# Patient Record
Sex: Female | Born: 1990 | Race: White | Hispanic: Yes | Marital: Married | Smoking: Former smoker
Health system: Southern US, Community
[De-identification: ages and names within clinical notes are randomized; demographics above are authoritative.]

## PROBLEM LIST (undated history)

## (undated) ENCOUNTER — Inpatient Hospital Stay (HOSPITAL_COMMUNITY): Payer: Self-pay

## (undated) HISTORY — PX: WISDOM TOOTH EXTRACTION: SHX21

---
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
## 2009-04-06 ENCOUNTER — Inpatient Hospital Stay (HOSPITAL_COMMUNITY): Admission: AD | Admit: 2009-04-06 | Discharge: 2009-04-06 | Payer: Self-pay | Admitting: Obstetrics and Gynecology

## 2009-04-26 ENCOUNTER — Inpatient Hospital Stay (HOSPITAL_COMMUNITY): Admission: AD | Admit: 2009-04-26 | Discharge: 2009-04-28 | Payer: Self-pay | Admitting: Obstetrics & Gynecology

## 2009-11-16 ENCOUNTER — Emergency Department (HOSPITAL_COMMUNITY): Admission: EM | Admit: 2009-11-16 | Discharge: 2009-11-17 | Payer: Self-pay | Admitting: Emergency Medicine

## 2010-06-18 ENCOUNTER — Emergency Department (HOSPITAL_COMMUNITY)
Admission: EM | Admit: 2010-06-18 | Discharge: 2010-06-18 | Payer: Self-pay | Source: Home / Self Care | Admitting: Emergency Medicine

## 2010-09-29 ENCOUNTER — Emergency Department (HOSPITAL_COMMUNITY)
Admission: EM | Admit: 2010-09-29 | Discharge: 2010-09-29 | Payer: Self-pay | Source: Home / Self Care | Admitting: Emergency Medicine

## 2010-10-09 ENCOUNTER — Emergency Department (HOSPITAL_COMMUNITY)
Admission: EM | Admit: 2010-10-09 | Discharge: 2010-10-09 | Payer: Self-pay | Source: Home / Self Care | Admitting: Emergency Medicine

## 2010-12-21 LAB — COMPREHENSIVE METABOLIC PANEL
AST: 34 U/L (ref 0–37)
Albumin: 3.8 g/dL (ref 3.5–5.2)
Creatinine, Ser: 0.84 mg/dL (ref 0.4–1.2)
GFR calc Af Amer: >60 mL/min (ref >60)
GFR calc non Af Amer: >60 mL/min (ref >60)
Glucose, Bld: 126 mg/dL — ABNORMAL HIGH (ref 70–99)
Sodium: 134 mEq/L — ABNORMAL LOW (ref 135–145)
Total Protein: 7.2 g/dL (ref 6.0–8.3)

## 2010-12-21 LAB — CBC
MCV: 85.4 fL (ref 78.0–100.0)
Platelets: 275 10*3/uL (ref 150–400)

## 2010-12-21 LAB — DIFFERENTIAL
Basophils Relative: 0 % (ref 0–1)
Eosinophils Absolute: 0 10*3/uL (ref 0.0–0.7)
Eosinophils Relative: 0 % (ref 0–5)
Lymphs Abs: 0.7 10*3/uL (ref 0.7–4.0)
Monocytes Absolute: 0.6 10*3/uL (ref 0.1–1.0)
Neutrophils Relative %: 84 % — ABNORMAL HIGH (ref 43–77)

## 2010-12-21 LAB — URINALYSIS, ROUTINE W REFLEX MICROSCOPIC
Hgb urine dipstick: NEGATIVE
Ketones, ur: NEGATIVE mg/dL
Nitrite: NEGATIVE
Specific Gravity, Urine: 1.035 — ABNORMAL HIGH (ref 1.005–1.030)
Urobilinogen, UA: 0.2 mg/dL (ref 0.0–1.0)
pH: 7 (ref 5.0–8.0)

## 2010-12-21 LAB — POCT PREGNANCY, URINE: Preg Test, Ur: NEGATIVE

## 2010-12-27 LAB — POCT PREGNANCY, URINE: Preg Test, Ur: NEGATIVE

## 2011-01-14 LAB — CBC
HCT: 33.9 % — ABNORMAL LOW (ref 36.0–46.0)
Hemoglobin: 9.9 g/dL — ABNORMAL LOW (ref 12.0–15.0)
MCV: 85.5 fL (ref 78.0–100.0)
MCV: 86.9 fL (ref 78.0–100.0)
RBC: 3.52 MIL/uL — ABNORMAL LOW (ref 3.87–5.11)
RBC: 3.97 MIL/uL (ref 3.87–5.11)
RDW: 15.2 % (ref 11.5–15.5)
WBC: 12.6 10*3/uL — ABNORMAL HIGH (ref 4.0–10.5)
WBC: 19.7 10*3/uL — ABNORMAL HIGH (ref 4.0–10.5)

## 2011-01-14 LAB — RPR: RPR Ser Ql: NONREACTIVE

## 2011-01-23 LAB — DIFFERENTIAL
Basophils Absolute: 0 10*3/uL (ref 0.0–0.1)
Lymphs Abs: 1.5 10*3/uL (ref 0.7–4.0)
Monocytes Absolute: 0.6 10*3/uL (ref 0.1–1.0)
Monocytes Relative: 6 % (ref 3–12)

## 2011-01-23 LAB — URINALYSIS, ROUTINE W REFLEX MICROSCOPIC
Glucose, UA: NEGATIVE mg/dL
Nitrite: NEGATIVE
Specific Gravity, Urine: 1.03 (ref 1.005–1.030)

## 2011-01-23 LAB — BASIC METABOLIC PANEL
GFR calc Af Amer: >60 mL/min (ref >60)
GFR calc non Af Amer: >60 mL/min (ref >60)
Glucose, Bld: 87 mg/dL (ref 70–99)
Potassium: 3.9 mEq/L (ref 3.5–5.1)
Sodium: 136 mEq/L (ref 135–145)

## 2011-01-23 LAB — CBC
HCT: 35.8 % — ABNORMAL LOW (ref 36.0–46.0)
Hemoglobin: 12.4 g/dL (ref 12.0–15.0)
MCHC: 34.5 g/dL (ref 30.0–36.0)
Platelets: 227 10*3/uL (ref 150–400)

## 2011-01-23 LAB — URINE MICROSCOPIC-ADD ON

## 2011-01-23 LAB — URINE CULTURE: Colony Count: NO GROWTH
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
## 2011-07-10 LAB — URINALYSIS, ROUTINE W REFLEX MICROSCOPIC
Bilirubin Urine: NEGATIVE
Glucose, UA: NEGATIVE
Hgb urine dipstick: NEGATIVE
Ketones, ur: NEGATIVE
Protein, ur: NEGATIVE

## 2011-07-10 LAB — BASIC METABOLIC PANEL
BUN: 14
CO2: 26
Chloride: 107
Creatinine, Ser: 0.7
Glucose, Bld: 83
Potassium: 4.1

## 2011-07-20 LAB — CBC
HCT: 39.4
Hemoglobin: 13.5
MCV: 88.2
Platelets: 234
RDW: 13.1

## 2011-07-20 LAB — DIFFERENTIAL
Basophils Absolute: 0
Basophils Relative: 1
Lymphocytes Relative: 19 — ABNORMAL LOW
Monocytes Absolute: 0.4
Neutro Abs: 5.1
Neutrophils Relative %: 74 — ABNORMAL HIGH

## 2011-07-20 LAB — COMPREHENSIVE METABOLIC PANEL
Albumin: 4.3
Alkaline Phosphatase: 53
BUN: 21
Creatinine, Ser: 0.94
Glucose, Bld: 91
Total Bilirubin: 0.9
Total Protein: 7.7

## 2011-07-20 LAB — LIPASE, BLOOD: Lipase: 36

## 2011-07-24 LAB — URINALYSIS, ROUTINE W REFLEX MICROSCOPIC
Glucose, UA: NEGATIVE
Nitrite: NEGATIVE
Protein, ur: NEGATIVE
Urobilinogen, UA: 0.2

## 2011-07-24 LAB — BASIC METABOLIC PANEL
BUN: 18
CO2: 20
CO2: 20
Calcium: 9
Chloride: 101
Creatinine, Ser: 0.81
Creatinine, Ser: 1.14
Glucose, Bld: 104 — ABNORMAL HIGH
Glucose, Bld: 138 — ABNORMAL HIGH
Potassium: 3.8

## 2011-07-24 LAB — HEPATIC FUNCTION PANEL
AST: 19
Albumin: 4.1
Alkaline Phosphatase: 49
Total Protein: 7.7

## 2011-07-24 LAB — CBC
HCT: 38
MCV: 87.3
MCV: 87.6
Platelets: 247
RBC: 4.34
RBC: 4.78
WBC: 7.1
WBC: 8.4

## 2011-07-24 LAB — T4, FREE: Free T4: 1.47

## 2011-07-24 LAB — DIFFERENTIAL
Basophils Absolute: 0.1
Basophils Relative: 1
Eosinophils Absolute: 0
Eosinophils Relative: 3
Lymphocytes Relative: 37
Lymphs Abs: 2.4
Neutro Abs: 3.4
Neutro Abs: 5.4
Neutrophils Relative %: 65

## 2011-07-24 LAB — LIPID PANEL: Triglycerides: 88

## 2011-07-24 LAB — RAPID URINE DRUG SCREEN, HOSP PERFORMED
Amphetamines: NOT DETECTED
Barbiturates: NOT DETECTED
Benzodiazepines: NOT DETECTED
Tetrahydrocannabinol: NOT DETECTED

## 2011-07-24 LAB — COMPREHENSIVE METABOLIC PANEL
AST: 21
BUN: 11
CO2: 25
Chloride: 108
Creatinine, Ser: 0.86
Total Bilirubin: 0.4

## 2011-07-24 LAB — PREGNANCY, URINE: Preg Test, Ur: NEGATIVE

## 2011-07-24 LAB — LIPASE, BLOOD: Lipase: 66 — ABNORMAL HIGH

## 2011-07-24 LAB — ACETAMINOPHEN LEVEL: Acetaminophen (Tylenol), Serum: <10 — ABNORMAL LOW

## 2011-07-24 LAB — SALICYLATE LEVEL: Salicylate Lvl: <4

## 2011-07-24 LAB — RPR: RPR Ser Ql: NONREACTIVE

## 2011-07-24 LAB — GC/CHLAMYDIA PROBE AMP, URINE: Chlamydia, Swab/Urine, PCR: NEGATIVE

## 2011-07-24 LAB — URINE MICROSCOPIC-ADD ON

## 2013-10-03 ENCOUNTER — Encounter (HOSPITAL_BASED_OUTPATIENT_CLINIC_OR_DEPARTMENT_OTHER): Payer: Self-pay | Admitting: Emergency Medicine

## 2013-10-03 ENCOUNTER — Emergency Department (HOSPITAL_BASED_OUTPATIENT_CLINIC_OR_DEPARTMENT_OTHER)
Admission: EM | Admit: 2013-10-03 | Discharge: 2013-10-03 | Disposition: A | Discharge status: Home / Self Care | Payer: Medicaid Other | Attending: Emergency Medicine | Admitting: Emergency Medicine

## 2013-10-03 DIAGNOSIS — Z87891 Personal history of nicotine dependence: Secondary | ICD-10-CM | POA: Insufficient documentation

## 2013-10-03 DIAGNOSIS — R6889 Other general symptoms and signs: Secondary | ICD-10-CM

## 2013-10-03 DIAGNOSIS — J111 Influenza due to unidentified influenza virus with other respiratory manifestations: Secondary | ICD-10-CM | POA: Insufficient documentation

## 2013-10-03 MED ORDER — PHENYLEPH-PROMETHAZINE-COD 5-6.25-10 MG/5ML PO SYRP: 5.0000 mL | ORAL_SOLUTION | ORAL | Status: DC | PRN

## 2013-10-03 NOTE — ED Provider Notes (Signed)
CSN: [id number]     Arrival date & time 10/03/13  1637 History   First MD Initiated Contact with Patient 10/03/13 1946     Chief Complaint  Patient presents with  . Cough  . Sore Throat   (Consider location/radiation/quality/duration/timing/severity/associated sxs/prior Treatment) Patient is a 22 y.o. female presenting with pharyngitis and URI. The history is provided by the patient.  Sore Throat Associated symptoms include chills, congestion, coughing, a fever, headaches, myalgias, a sore throat and swollen glands. Pertinent negatives include no rash.  URI Presenting symptoms: congestion, cough, fever, rhinorrhea and sore throat   Presenting symptoms: no ear pain   Severity:  Moderate Onset quality:  Gradual Duration:  2 days Timing:  Constant Progression:  Improving Chronicity:  New Relieved by:  Nothing Worsened by:  Nothing tried Ineffective treatments:  OTC medications Associated symptoms: headaches, myalgias, sinus pain, sneezing and swollen glands   Associated symptoms: no wheezing    Regina Weaver is a 22 y.o. female who presents to the ED with fever, sore throat and congestion that started 2 days ago. The sore throat has gotten better, the cough is just occasional. She has sinus congestion and runny nose.  History reviewed. No pertinent past medical history. History reviewed. No pertinent past surgical history. No family history on file. History  Substance Use Topics  . Smoking status: Former Games developer  . Smokeless tobacco: Never Used  . Alcohol Use: Yes     Comment: occasion   OB History   Grav Para Term Preterm Abortions TAB SAB Ect Mult Living                 Review of Systems  Constitutional: Positive for fever and chills. Negative for appetite change.  HENT: Positive for congestion, rhinorrhea, sneezing and sore throat. Negative for ear pain.   Eyes: Negative for itching.  Respiratory: Positive for cough. Negative for chest tightness and wheezing.    Musculoskeletal: Positive for myalgias.  Skin: Negative for rash.  Neurological: Positive for headaches.  Psychiatric/Behavioral: Negative for confusion. The patient is not nervous/anxious.     Allergies  Review of patient's allergies indicates no known allergies.  Home Medications  No current outpatient prescriptions on file. BP 117/69  Pulse 69  Temp(Src) 98.2 F (36.8 C) (Oral)  Resp 18  Ht 5\' 2"  (1.575 m)  Wt 160 lb (72.576 kg)  BMI 29.26 kg/m2  SpO2 100%  LMP 09/07/2013 Physical Exam  Nursing note and vitals reviewed. Constitutional: She is oriented to person, place, and time. She appears well-developed and well-nourished. No distress.  HENT:  Head: Normocephalic and atraumatic.  Eyes: EOM are normal.  Neck: Neck supple.  Cardiovascular: Normal rate and regular rhythm.   Pulmonary/Chest: Effort normal and breath sounds normal.  Abdominal: Soft. Bowel sounds are normal. There is no tenderness.  Musculoskeletal: Normal range of motion.  Neurological: She is alert and oriented to person, place, and time. No cranial nerve deficit.  Skin: Skin is warm and dry.  Psychiatric: She has a normal mood and affect. Her behavior is normal.    ED Course  Procedures   MDM  22 y.o. female with cough, fever and sore throat x 4 days. Will treat for flu like symptoms with cough medications and she will take tylenol and ibuprofen as needed for fever and aching. Stable for discharge. BP 117/69  Pulse 69  Temp(Src) 98.2 F (36.8 C) (Oral)  Resp 18  Ht 5\' 2"  (1.575 m)  Wt 160 lb (72.576 kg)  BMI 29.26 kg/m2  SpO2 100%  LMP 09/07/2013    Medication List         Phenyleph-Promethazine-Cod 5-6.25-10 MG/5ML Syrp  Take 5 mLs by mouth every 4 (four) hours as needed.           [address] [place], [place] 10/05/13 [phone]

## 2013-10-03 NOTE — ED Notes (Signed)
Cough fever sore throat since Tuesday. Vomited x 1 yesterday

## 2013-10-12 NOTE — ED Provider Notes (Signed)
Medical screening examination/treatment/procedure(s) were performed by non-physician practitioner and as supervising physician I was immediately available for consultation/collaboration.  EKG Interpretation   None         Larah Kuntzman Y. Marrisa Kimber, MD 10/12/13 1525 

## 2014-02-10 ENCOUNTER — Emergency Department (HOSPITAL_COMMUNITY)
Admission: EM | Admit: 2014-02-10 | Discharge: 2014-02-10 | Disposition: A | Discharge status: Home / Self Care | Payer: Medicaid Other | Attending: Emergency Medicine | Admitting: Emergency Medicine

## 2014-02-10 ENCOUNTER — Encounter (HOSPITAL_COMMUNITY): Payer: Self-pay | Admitting: Emergency Medicine

## 2014-02-10 DIAGNOSIS — Z8669 Personal history of other diseases of the nervous system and sense organs: Secondary | ICD-10-CM | POA: Insufficient documentation

## 2014-02-10 DIAGNOSIS — Z87891 Personal history of nicotine dependence: Secondary | ICD-10-CM | POA: Insufficient documentation

## 2014-02-10 DIAGNOSIS — Z792 Long term (current) use of antibiotics: Secondary | ICD-10-CM | POA: Insufficient documentation

## 2014-02-10 DIAGNOSIS — Z79899 Other long term (current) drug therapy: Secondary | ICD-10-CM | POA: Insufficient documentation

## 2014-02-10 DIAGNOSIS — L509 Urticaria, unspecified: Secondary | ICD-10-CM | POA: Insufficient documentation

## 2014-02-10 DIAGNOSIS — IMO0002 Reserved for concepts with insufficient information to code with codable children: Secondary | ICD-10-CM | POA: Insufficient documentation

## 2014-02-10 DIAGNOSIS — Z3202 Encounter for pregnancy test, result negative: Secondary | ICD-10-CM | POA: Insufficient documentation

## 2014-02-10 LAB — URINALYSIS, ROUTINE W REFLEX MICROSCOPIC
Bilirubin Urine: NEGATIVE
GLUCOSE, UA: NEGATIVE mg/dL
HGB URINE DIPSTICK: NEGATIVE
KETONES UR: NEGATIVE mg/dL
Leukocytes, UA: NEGATIVE
Nitrite: NEGATIVE
PROTEIN: NEGATIVE mg/dL
Specific Gravity, Urine: 1.031 — ABNORMAL HIGH (ref 1.005–1.030)
UROBILINOGEN UA: 0.2 mg/dL (ref 0.0–1.0)
pH: 5 (ref 5.0–8.0)

## 2014-02-10 LAB — CBC WITH DIFFERENTIAL/PLATELET
BASOS ABS: 0 10*3/uL (ref 0.0–0.1)
BASOS PCT: 0 % (ref 0–1)
EOS ABS: 0 10*3/uL (ref 0.0–0.7)
EOS PCT: 0 % (ref 0–5)
HEMATOCRIT: 42 % (ref 36.0–46.0)
Hemoglobin: 14.1 g/dL (ref 12.0–15.0)
Lymphocytes Relative: 12 % (ref 12–46)
Lymphs Abs: 1.4 10*3/uL (ref 0.7–4.0)
MCH: 29.4 pg (ref 26.0–34.0)
MCHC: 33.6 g/dL (ref 30.0–36.0)
MCV: 87.7 fL (ref 78.0–100.0)
MONO ABS: 1 10*3/uL (ref 0.1–1.0)
Monocytes Relative: 8 % (ref 3–12)
Neutro Abs: 9.6 10*3/uL — ABNORMAL HIGH (ref 1.7–7.7)
Neutrophils Relative %: 80 % — ABNORMAL HIGH (ref 43–77)
Platelets: 271 10*3/uL (ref 150–400)
RBC: 4.79 MIL/uL (ref 3.87–5.11)
RDW: 14.2 % (ref 11.5–15.5)
WBC: 12.1 10*3/uL — ABNORMAL HIGH (ref 4.0–10.5)

## 2014-02-10 LAB — COMPREHENSIVE METABOLIC PANEL
ALBUMIN: 4 g/dL (ref 3.5–5.2)
ALT: 14 U/L (ref 0–35)
AST: 16 U/L (ref 0–37)
Alkaline Phosphatase: 84 U/L (ref 39–117)
BUN: 13 mg/dL (ref 6–23)
CALCIUM: 9.3 mg/dL (ref 8.4–10.5)
CO2: 20 meq/L (ref 19–32)
CREATININE: 0.78 mg/dL (ref 0.50–1.10)
Chloride: 103 mEq/L (ref 96–112)
GFR calc Af Amer: >90 mL/min (ref >90)
Glucose, Bld: 100 mg/dL — ABNORMAL HIGH (ref 70–99)
Potassium: 4 mEq/L (ref 3.7–5.3)
Sodium: 139 mEq/L (ref 137–147)
TOTAL PROTEIN: 7.9 g/dL (ref 6.0–8.3)
Total Bilirubin: 0.2 mg/dL — ABNORMAL LOW (ref 0.3–1.2)

## 2014-02-10 LAB — URINE MICROSCOPIC-ADD ON

## 2014-02-10 LAB — POC URINE PREG, ED: Preg Test, Ur: NEGATIVE

## 2014-02-10 LAB — RAPID STREP SCREEN (MED CTR MEBANE ONLY): Streptococcus, Group A Screen (Direct): NEGATIVE

## 2014-02-10 MED ORDER — DIPHENHYDRAMINE HCL 50 MG/ML IJ SOLN
25.0000 mg | Freq: Once | INTRAMUSCULAR | Status: AC
  Administered 2014-02-10: 25 mg via INTRAVENOUS
  Filled 2014-02-10: qty 1

## 2014-02-10 MED ORDER — FAMOTIDINE IN NACL 20-0.9 MG/50ML-% IV SOLN
20.0000 mg | Freq: Once | INTRAVENOUS | Status: AC
  Administered 2014-02-10: 20 mg via INTRAVENOUS
  Filled 2014-02-10: qty 50

## 2014-02-10 MED ORDER — PREDNISONE 50 MG PO TABS: 50.0000 mg | ORAL_TABLET | Freq: Every day | ORAL | Status: DC

## 2014-02-10 MED ORDER — METHYLPREDNISOLONE SODIUM SUCC 125 MG IJ SOLR
125.0000 mg | Freq: Once | INTRAMUSCULAR | Status: AC
  Administered 2014-02-10: 125 mg via INTRAVENOUS
  Filled 2014-02-10: qty 2

## 2014-02-10 NOTE — ED Provider Notes (Signed)
CSN: [id number]     Arrival date & time 02/10/14  0307 History   First MD Initiated Contact with Patient 02/10/14 [phone]     Chief Complaint  Patient presents with  . Urticaria     (Consider location/radiation/quality/duration/timing/severity/associated sxs/prior Treatment) Patient is a 23 y.o. female presenting with urticaria. The history is provided by the patient.  Urticaria  She started having problems 3 days ago with a sense of something swollen in her throat which she coughed out. Yesterday, she developed generalized swelling and itching and went to an urgent care Center where she was given prescriptions for cetirizine-pseudoephedrine, azithromycin, prednisone, and furosemide. She was told that there was an ear infection and that she had protein in her urine. Prednisone dose is 10 mg once a day. She comes in with ongoing problems with rash and itching which have not improved. She states that she does have some dyspnea which is worse when she lays flat. She's not having any difficulty swallowing. Denies any unusual exposures.  History reviewed. No pertinent past medical history. History reviewed. No pertinent past surgical history. No family history on file. History  Substance Use Topics  . Smoking status: Former Games developermoker  . Smokeless tobacco: Never Used  . Alcohol Use: Yes     Comment: occasion   OB History   Grav Para Term Preterm Abortions TAB SAB Ect Mult Living                 Review of Systems  All other systems reviewed and are negative.     Allergies  Review of patient's allergies indicates no known allergies.  Home Medications   Prior to Admission medications   Medication Sig Start Date End Date Taking? Authorizing Provider  azithromycin (ZITHROMAX) 250 MG tablet Take 250-500 mg by mouth daily.   Yes Historical Provider, MD  cetirizine-pseudoephedrine (ZYRTEC-D) 5-120 MG per tablet Take 1 tablet by mouth 2 (two) times daily.   Yes Historical Provider, MD   predniSONE (DELTASONE) 10 MG tablet Take 10 mg by mouth daily with breakfast.   Yes Historical Provider, MD  furosemide (LASIX) 20 MG tablet Take 20 mg by mouth daily as needed for fluid.    Historical Provider, MD   BP 101/83  Pulse 94  Temp(Src) 97.7 F (36.5 C) (Oral)  Resp 18  SpO2 98% Physical Exam  Nursing note and vitals reviewed.  23 year old female, resting comfortably and in no acute distress. Vital signs are normal. Oxygen saturation is 98%, which is normal. Head is normocephalic and atraumatic. PERRLA, EOMI. Oropharynx is shows mild erythema with mild edema of the uvula. There is no pooling of secretions and phonation is normal. There is no stridor. TMs are clear. Neck is nontender and supple without adenopathy or JVD. Back is nontender and there is no CVA tenderness. Lungs have a prolonged exhalation phase without overt rales, wheezes, or rhonchi. Chest is nontender. Heart has regular rate and rhythm without murmur. Abdomen is soft, flat, nontender without masses or hepatosplenomegaly and peristalsis is normoactive. Extremities have no cyanosis or edema, full range of motion is present. Skin: Urticarial rash is present diffusely. Neurologic: Mental status is normal, cranial nerves are intact, there are no motor or sensory deficits.  ED Course  Procedures (including critical care time) Labs Review Results for orders placed during the hospital encounter of 02/10/14  RAPID STREP SCREEN      Result Value Ref Range   Streptococcus, Group A Screen (Direct) NEGATIVE  NEGATIVE  URINALYSIS, ROUTINE W REFLEX MICROSCOPIC      Result Value Ref Range   Color, Urine YELLOW  YELLOW   APPearance TURBID (*) CLEAR   Specific Gravity, Urine 1.031 (*) 1.005 - 1.030   pH 5.0  5.0 - 8.0   Glucose, UA NEGATIVE  NEGATIVE mg/dL   Hgb urine dipstick NEGATIVE  NEGATIVE   Bilirubin Urine NEGATIVE  NEGATIVE   Ketones, ur NEGATIVE  NEGATIVE mg/dL   Protein, ur NEGATIVE  NEGATIVE mg/dL    Urobilinogen, UA 0.2  0.0 - 1.0 mg/dL   Nitrite NEGATIVE  NEGATIVE   Leukocytes, UA NEGATIVE  NEGATIVE  CBC WITH DIFFERENTIAL      Result Value Ref Range   WBC 12.1 (*) 4.0 - 10.5 K/uL   RBC 4.79  3.87 - 5.11 MIL/uL   Hemoglobin 14.1  12.0 - 15.0 g/dL   HCT 16.142.0  09.636.0 - 04.546.0 %   MCV 87.7  78.0 - 100.0 fL   MCH 29.4  26.0 - 34.0 pg   MCHC 33.6  30.0 - 36.0 g/dL   RDW 40.914.2  81.111.5 - 91.415.5 %   Platelets 271  150 - 400 K/uL   Neutrophils Relative % 80 (*) 43 - 77 %   Neutro Abs 9.6 (*) 1.7 - 7.7 K/uL   Lymphocytes Relative 12  12 - 46 %   Lymphs Abs 1.4  0.7 - 4.0 K/uL   Monocytes Relative 8  3 - 12 %   Monocytes Absolute 1.0  0.1 - 1.0 K/uL   Eosinophils Relative 0  0 - 5 %   Eosinophils Absolute 0.0  0.0 - 0.7 K/uL   Basophils Relative 0  0 - 1 %   Basophils Absolute 0.0  0.0 - 0.1 K/uL  COMPREHENSIVE METABOLIC PANEL      Result Value Ref Range   Sodium 139  137 - 147 mEq/L   Potassium 4.0  3.7 - 5.3 mEq/L   Chloride 103  96 - 112 mEq/L   CO2 20  19 - 32 mEq/L   Glucose, Bld 100 (*) 70 - 99 mg/dL   BUN 13  6 - 23 mg/dL   Creatinine, Ser 7.820.78  0.50 - 1.10 mg/dL   Calcium 9.3  8.4 - 95.610.5 mg/dL   Total Protein 7.9  6.0 - 8.3 g/dL   Albumin 4.0  3.5 - 5.2 g/dL   AST 16  0 - 37 U/L   ALT 14  0 - 35 U/L   Alkaline Phosphatase 84  39 - 117 U/L   Total Bilirubin 0.2 (*) 0.3 - 1.2 mg/dL   GFR calc non Af Amer >90  >90 mL/min   GFR calc Af Amer >90  >90 mL/min  URINE MICROSCOPIC-ADD ON      Result Value Ref Range   Squamous Epithelial / LPF FEW (*) RARE   Bacteria, UA MANY (*) RARE   Urine-Other AMORPHOUS URATES/PHOSPHATES    POC URINE PREG, ED      Result Value Ref Range   Preg Test, Ur NEGATIVE  NEGATIVE   MDM   Final diagnoses:  Urticaria    Urticaria of uncertain cause. She does not show evidence of ear infection. I am curious whether she might have a strep infection behind her rash though strep screen will be obtained. No other evidence of ongoing infection. Prednisone  dose is clearly going to be given dose of methylprednisolone in the ED as well as diphenhydramine and famotidine. Metabolic panel and urinalysis  will be checked.  She had good relief of itching with the above-noted treatment although urticaria was still present. Urinalysis shows no evidence of proteinuria and metabolic panel is completely normal. Patient is advised of these findings. At this point, I do not feel that she needs to continue with furosemide and 6 told to discontinue that. I recommended that she add an H2 blocker to her cetirizine-pseudoephedrine and I'm increasing her dose of prednisone. She's given a prescription for prednisone 50 mg tablets what she is to take for 5 days. She is to continue to use diphenhydramine as needed.  Dione Booze, MD 02/10/14 [phone]

## 2014-02-10 NOTE — ED Notes (Signed)
Pt c/o urticaria all over body. Pt states she has not eaten any new food or had any new medications but does state she used a new hair product and that is the only thing she can think of that she did different. Pt states she went to urgent care and was given prednisone, azithromycin, an antihistamine, and lasix. Pt has swelling to eye lids, and back of ears. Pt rates pain 8/10.

## 2014-02-10 NOTE — ED Notes (Signed)
Pt. reports itchy hives at forehead , right ear , scalp and upper back onset yesterday , seen at an Urgent Care yesterday , prescribed with medications with no relief. Airway intact /respirations unlabored . No tongue or throat swelling .

## 2014-02-10 NOTE — Discharge Instructions (Signed)
Stop taking Furosemide. Stop taking your Prednisone - you are getting a new prescription to replace it. Take Pepcid or Zantac twice a day (bolth work equally well). Take your Zyrtec twice a day. Take Benadryl as needed for itching not controlled by your other medications.  Hives Hives are itchy, red, swollen areas of the skin. They can vary in size and location on your body. Hives can come and go for hours or several days (acute hives) or for several weeks (chronic hives). Hives do not spread from person to person (noncontagious). They may get worse with scratching, exercise, and emotional stress. CAUSES   Allergic reaction to food, additives, or drugs.  Infections, including the common cold.  Illness, such as vasculitis, lupus, or thyroid disease.  Exposure to sunlight, heat, or cold.  Exercise.  Stress.  Contact with chemicals. SYMPTOMS   Red or white swollen patches on the skin. The patches may change size, shape, and location quickly and repeatedly.  Itching.  Swelling of the hands, feet, and face. This may occur if hives develop deeper in the skin. DIAGNOSIS  Your caregiver can usually tell what is wrong by performing a physical exam. Skin or blood tests may also be done to determine the cause of your hives. In some cases, the cause cannot be determined. TREATMENT  Mild cases usually get better with medicines such as antihistamines. Severe cases may require an emergency epinephrine injection. If the cause of your hives is known, treatment includes avoiding that trigger.  HOME CARE INSTRUCTIONS   Avoid causes that trigger your hives.  Take antihistamines as directed by your caregiver to reduce the severity of your hives. Non-sedating or low-sedating antihistamines are usually recommended. Do not drive while taking an antihistamine.  Take any other medicines prescribed for itching as directed by your caregiver.  Wear loose-fitting clothing.  Keep all follow-up appointments  as directed by your caregiver. SEEK MEDICAL CARE IF:   You have persistent or severe itching that is not relieved with medicine.  You have painful or swollen joints. SEEK IMMEDIATE MEDICAL CARE IF:   You have a fever.  Your tongue or lips are swollen.  You have trouble breathing or swallowing.  You feel tightness in the throat or chest.  You have abdominal pain. These problems may be the first sign of a life-threatening allergic reaction. Call your local emergency services (911 in [place] MAKE SURE YOU:   Understand these instructions.  Will watch your condition.  Will get help right away if you are not doing well or get worse. Document Released: 09/24/2005 Document Revised: 03/25/2012 Document Reviewed: 12/18/2011 [facility] Patient Information 2014 [place], [place].

## 2014-02-12 LAB — CULTURE, GROUP A STREP

## 2015-04-13 ENCOUNTER — Encounter (HOSPITAL_COMMUNITY): Payer: Self-pay | Admitting: Emergency Medicine

## 2015-04-13 ENCOUNTER — Emergency Department (HOSPITAL_COMMUNITY)
Admission: EM | Admit: 2015-04-13 | Discharge: 2015-04-13 | Disposition: A | Discharge status: Home / Self Care | Payer: Federal, State, Local not specified - PPO | Attending: Emergency Medicine | Admitting: Emergency Medicine

## 2015-04-13 DIAGNOSIS — O99511 Diseases of the respiratory system complicating pregnancy, first trimester: Secondary | ICD-10-CM | POA: Diagnosis not present

## 2015-04-13 DIAGNOSIS — O9989 Other specified diseases and conditions complicating pregnancy, childbirth and the puerperium: Secondary | ICD-10-CM | POA: Diagnosis not present

## 2015-04-13 DIAGNOSIS — Z87891 Personal history of nicotine dependence: Secondary | ICD-10-CM | POA: Insufficient documentation

## 2015-04-13 DIAGNOSIS — O219 Vomiting of pregnancy, unspecified: Secondary | ICD-10-CM

## 2015-04-13 DIAGNOSIS — J209 Acute bronchitis, unspecified: Secondary | ICD-10-CM | POA: Insufficient documentation

## 2015-04-13 DIAGNOSIS — R1013 Epigastric pain: Secondary | ICD-10-CM | POA: Diagnosis not present

## 2015-04-13 DIAGNOSIS — O210 Mild hyperemesis gravidarum: Secondary | ICD-10-CM | POA: Insufficient documentation

## 2015-04-13 DIAGNOSIS — J40 Bronchitis, not specified as acute or chronic: Secondary | ICD-10-CM

## 2015-04-13 DIAGNOSIS — Z3A11 11 weeks gestation of pregnancy: Secondary | ICD-10-CM | POA: Diagnosis not present

## 2015-04-13 LAB — URINALYSIS, ROUTINE W REFLEX MICROSCOPIC
GLUCOSE, UA: NEGATIVE mg/dL
HGB URINE DIPSTICK: NEGATIVE
Ketones, ur: NEGATIVE mg/dL
Nitrite: NEGATIVE
Protein, ur: 100 mg/dL — AB
SPECIFIC GRAVITY, URINE: 1.031 — AB (ref 1.005–1.030)
UROBILINOGEN UA: 0.2 mg/dL (ref 0.0–1.0)
pH: 8.5 — ABNORMAL HIGH (ref 5.0–8.0)

## 2015-04-13 LAB — URINE MICROSCOPIC-ADD ON

## 2015-04-13 MED ORDER — AZITHROMYCIN 250 MG PO TABS: ORAL_TABLET | ORAL | Status: DC

## 2015-04-13 MED ORDER — PROMETHAZINE HCL 25 MG PO TABS: 25.0000 mg | ORAL_TABLET | Freq: Four times a day (QID) | ORAL | Status: DC | PRN

## 2015-04-13 MED ORDER — SODIUM CHLORIDE 0.9 % IV SOLN
INTRAVENOUS | Status: AC
  Administered 2015-04-13: 21:00:00 via INTRAVENOUS

## 2015-04-13 MED ORDER — GUAIFENESIN 100 MG/5ML PO SYRP: 100.0000 mg | ORAL_SOLUTION | ORAL | Status: DC | PRN

## 2015-04-13 MED ORDER — PROMETHAZINE HCL 25 MG/ML IJ SOLN
12.5000 mg | Freq: Once | INTRAMUSCULAR | Status: AC
  Administered 2015-04-13: 12.5 mg via INTRAVENOUS
  Filled 2015-04-13 (×2): qty 1

## 2015-04-13 MED ORDER — HYDROCOD POLST-CPM POLST ER 10-8 MG/5ML PO SUER
5.0000 mL | Freq: Once | ORAL | Status: AC
  Administered 2015-04-13: 5 mL via ORAL
  Filled 2015-04-13: qty 5

## 2015-04-13 MED ORDER — AZITHROMYCIN 250 MG PO TABS
500.0000 mg | ORAL_TABLET | Freq: Once | ORAL | Status: AC
  Administered 2015-04-13: 500 mg via ORAL
  Filled 2015-04-13: qty 2

## 2015-04-13 NOTE — ED Notes (Signed)
Pt verbalizes understanding of d/c instructions and denies any further needs at this time. 

## 2015-04-13 NOTE — ED Notes (Signed)
Pt sts that everyone in her house has been sick. She c.o coughing, itching throat and vomiting. Pt is [redacted] weeks pregnant.

## 2015-04-13 NOTE — ED Notes (Signed)
Pt tolerating PO fluids

## 2015-04-13 NOTE — ED Provider Notes (Signed)
CSN: [id number]     Arrival date & time 04/13/15  1942 History   This chart was scribed for non-physician practitioner, [facility]. Damian LeavellNeese, NP, working with Benjiman CoreNathan Pickering, MD, by Budd PalmerVanessa Prueter ED Scribe. This patient was seen in room TR01C/TR01C and the patient's care was started at 8:15 PM    Chief Complaint  Patient presents with  . Cough  . Emesis   Patient is a 24 y.o. female presenting with cough and vomiting. The history is provided by the patient. No language interpreter was used.  Cough Cough characteristics:  Productive Sputum characteristics:  Green Severity:  Moderate Duration:  2 days Timing:  Constant Progression:  Worsening Chronicity:  New Smoker: no   Context: sick contacts   Relieved by:  None tried Associated symptoms: rhinorrhea   Associated symptoms: no fever   Emesis Associated symptoms: no abdominal pain    HPI Comments: Regina Weaver is a [age] y.o.  [redacted] weeks pregnant female who presents to the Emergency Department complaining of constant coughing and post-tussive emesis onset 1 day ago. Pt reports associated nausea, congestion yesterday, rhinorrhea, productive cough (green phlegm) and an itchy throat. She never took any medication for morning sickness, but she describes her current condition as different, as it lasts all day. [facility] who recommended she go to the closest ED. She states that she drank pineapple juice, which exacerbated the nausea. She denies fever and abdominal pain.  History reviewed. No pertinent past medical history. History reviewed. No pertinent past surgical history. No family history on file. History  Substance Use Topics  . Smoking status: Former Games developermoker  . Smokeless tobacco: Never Used  . Alcohol Use: Yes     Comment: occasion   OB History    Gravida Para Term Preterm AB TAB SAB Ectopic Multiple Living   1              Review of Systems  Constitutional: Negative for fever.  HENT: Positive for congestion and  rhinorrhea.   Respiratory: Positive for cough.   Gastrointestinal: Positive for vomiting. Negative for abdominal pain.  All other systems reviewed and are negative.   Allergies  Review of patient's allergies indicates no known allergies.  Home Medications   Prior to Admission medications   Medication Sig Start Date End Date Taking? Authorizing Provider  azithromycin (ZITHROMAX) 250 MG tablet Starting 04/14/15 take one tablet PO daily 04/13/15   [facility] Torrian Canion, NP  guaifenesin (ROBITUSSIN) 100 MG/5ML syrup Take 5-10 mLs (100-200 mg total) by mouth every 4 (four) hours as needed for cough. 04/13/15   Bobbyjo Marulanda Orlene OchM Brynlee Pennywell, NP  promethazine (PHENERGAN) 25 MG tablet Take 1 tablet (25 mg total) by mouth every 6 (six) hours as needed for nausea or vomiting. 04/13/15   Audris Speaker Orlene OchM Shawndra Clute, NP   BP 110/63 mmHg  Pulse 83  Temp(Src) 98.1 F (36.7 C) (Oral)  Resp 18  Ht 5\' 2"  (1.575 m)  Wt 145 lb (65.772 kg)  BMI 26.51 kg/m2  SpO2 100%  LMP 01/27/2015 Physical Exam  Constitutional: She is oriented to person, place, and time. She appears well-developed and well-nourished. No distress.  HENT:  Head: Normocephalic and atraumatic.  Mouth/Throat: Oropharynx is clear and moist.  Rhinorrhea  Eyes: Conjunctivae and EOM are normal. Pupils are equal, round, and reactive to light.  Neck: Normal range of motion. Neck supple. No tracheal deviation present.  Cardiovascular: Normal rate.   Pulmonary/Chest: Effort normal and breath sounds normal. No respiratory distress.  She has no wheezes. She has no rales.  Abdominal: Soft. Bowel sounds are normal. There is tenderness in the epigastric area. There is no CVA tenderness.  Tenderness is mild  Musculoskeletal: Normal range of motion.  Neurological: She is alert and oriented to person, place, and time.  Skin: Skin is warm and dry.  Psychiatric: She has a normal mood and affect. Her behavior is normal.  Nursing note and vitals reviewed.   ED Course  Procedures  DIAGNOSTIC  STUDIES: Oxygen Saturation is 97% on RA, normal by my interpretation.    COORDINATION OF CARE: 8:19 PM - Discussed plans to order IV fluids, antibiotics for possible bronchitis, and anti-nausea medicine. Pt advised of plan for treatment and pt agrees.  10:24 PM - informal bedside US show movement of fetus and cardiac activity.  Labs Review Results for orders placed or performed during the hospital encounter of 04/13/15 (from the past 24 hour(s))  Urinalysis, Routine w reflex microscopic (not at [facility])     Status: Abnormal   Collection Time: 04/13/15  8:40 PM  Result Value Ref Range   Color, Urine AMBER (A) YELLOW   APPearance TURBID (A) CLEAR   Specific Gravity, Urine 1.031 (H) 1.005 - 1.030   pH 8.5 (H) 5.0 - 8.0   Glucose, UA NEGATIVE NEGATIVE mg/dL   Hgb urine dipstick NEGATIVE NEGATIVE   Bilirubin Urine SMALL (A) NEGATIVE   Ketones, ur NEGATIVE NEGATIVE mg/dL   Protein, ur 161 (A) NEGATIVE mg/dL   Urobilinogen, UA 0.2 0.0 - 1.0 mg/dL   Nitrite NEGATIVE NEGATIVE   Leukocytes, UA MODERATE (A) NEGATIVE  Urine microscopic-add on     Status: None   Collection Time: 04/13/15  8:40 PM  Result Value Ref Range   Squamous Epithelial / LPF RARE RARE   WBC, UA 7-10 <3 WBC/hpf   RBC / HPF 0-2 <3 RBC/hpf   Bacteria, UA RARE RARE   Urine-Other MUCOUS PRESENT   Urine sent for culture tussionex 5 ccc given for cough MDM  24 y.o. female with cough and congestion, sore throat and vomiting x 2 days. Stable for d/c without vaginal bleeding. Vomiting has stopped after medication. Patient tolerating PO fluids. Discussed with the patient clinical and lab findings and plan of care and all questioned fully answered. She will follow up with her OB or [facility] if any problems arise.   Final diagnoses:  Bronchitis  Nausea and vomiting during pregnancy   I personally performed the services described in this documentation, which was scribed in my presence. The recorded information has been reviewed  and is accurate.    [address] [place], NP 04/13/15 2315  Benjiman Core, MD 04/14/15 [phone]

## 2015-04-13 NOTE — Discharge Instructions (Signed)
[facility] with [facility] doctor [facility]

## 2015-04-16 LAB — URINE CULTURE

## 2015-10-09 NOTE — L&D Delivery Note (Signed)
Delivery Note At 4:32 PM a viable female was delivered via Vaginal, Spontaneous Delivery (Presentation: ; Occiput Anterior).  APGAR: 8, 9; weight  .   Placenta status: Intact, Spontaneous.  Cord: 3 vessels with the following complications: None.  Cord pH: not indicated  Anesthesia: None  Episiotomy: None Lacerations: None Suture Repair: na Est. Blood Loss (mL): 100  Mom to postpartum.  Baby to Couplet care / Skin to Skin.  Shaquela Weichert A. 11/03/2015, 5:24 PM

## 2015-10-30 ENCOUNTER — Inpatient Hospital Stay (HOSPITAL_COMMUNITY)
Admission: AD | Admit: 2015-10-30 | Discharge: 2015-10-30 | Disposition: A | Discharge status: Home / Self Care | Payer: Federal, State, Local not specified - PPO | Source: Ambulatory Visit | Attending: Obstetrics | Admitting: Obstetrics

## 2015-10-30 ENCOUNTER — Encounter (HOSPITAL_COMMUNITY): Payer: Self-pay

## 2015-10-30 DIAGNOSIS — Z3A39 39 weeks gestation of pregnancy: Secondary | ICD-10-CM | POA: Insufficient documentation

## 2015-10-30 DIAGNOSIS — O368130 Decreased fetal movements, third trimester, not applicable or unspecified: Secondary | ICD-10-CM | POA: Insufficient documentation

## 2015-10-30 DIAGNOSIS — R102 Pelvic and perineal pain: Secondary | ICD-10-CM | POA: Insufficient documentation

## 2015-10-30 DIAGNOSIS — O471 False labor at or after 37 completed weeks of gestation: Secondary | ICD-10-CM

## 2015-10-30 NOTE — MAU Note (Signed)
Urine sent to Lab

## 2015-10-30 NOTE — MAU Note (Addendum)
Patient presents with c/o of decreased fetal movement for the past 2 days and she also lost her mucus plug this morning and it was bloody. Sharp pain in her pelvic area.

## 2015-10-30 NOTE — MAU Provider Note (Signed)
  History     CSN: [id number]  Arrival date and time: 10/30/15 1144     Chief Complaint  Patient presents with  . Decreased Fetal Movement   HPI  [name] is a 25 yo G30P2002 female at 39.[redacted] wks gestation by ultrasound, presenting with complaints of DFM and pelvic pressure.  Denies VB or LOF. Unsure of contractions. Has felt (+) FM since arrival to MAU. She was 3.5 cm last week in the office.  Her primary OB provider at [facility] is Dr. Seymour Bars.   History reviewed. No pertinent past medical history.  Past Surgical History  Procedure Laterality Date  . Wisdom tooth extraction      History reviewed. No pertinent family history.  Social History  Substance Use Topics  . Smoking status: Former Games developer  . Smokeless tobacco: Never Used  . Alcohol Use: Yes     Comment: occasion    Allergies: No Known Allergies  Prescriptions prior to admission  Medication Sig Dispense Refill Last Dose  . azithromycin (ZITHROMAX) 250 MG tablet Starting 04/14/15 take one tablet PO daily 4 tablet 0   . guaifenesin (ROBITUSSIN) 100 MG/5ML syrup Take 5-10 mLs (100-200 mg total) by mouth every 4 (four) hours as needed for cough. 118 mL 0   . promethazine (PHENERGAN) 25 MG tablet Take 1 tablet (25 mg total) by mouth every 6 (six) hours as needed for nausea or vomiting. 20 tablet 0     Review of Systems  Constitutional: Negative.   HENT: Negative.   Eyes: Negative.   Respiratory: Negative.   Cardiovascular: Negative.   Gastrointestinal: Negative.   Genitourinary:       Pelvic pressure; cramping; (+) FM since arrival to hospital  Musculoskeletal: Negative.   Skin: Negative.   Neurological: Negative.   Endo/Heme/Allergies: Negative.   Psychiatric/Behavioral: Negative.    CEFM  FHR: 140 bpm / moderate variability / accels present / decels absent TOCO: UI with 1 UC  Physical Exam   Blood pressure 120/66, pulse 89, temperature 98.5 F (36.9 C), temperature source Oral, resp. rate 16, last  menstrual period 01/27/2015.  Physical Exam  Constitutional: She appears well-developed and well-nourished.  GI: Soft. Bowel sounds are normal.  Genitourinary:  Gravid; soft, non-tender; VVE: 3.5/70/-2/vtx  Neurological: She is alert. She has normal reflexes.  Psychiatric: She has a normal mood and affect. Her behavior is normal. Thought content normal.    MAU Course  Procedures CEFM Assessment and Plan  25 yo G3P2002 at 39.[redacted] wks gestation False Labor Category 1 tracing  Discharge Home Labor precautions reviewed  Kenard Gower, MSN, CNM 10/30/2015, 12:23 PM

## 2015-10-30 NOTE — Discharge Instructions (Signed)
Braxton Hicks Contractions °Contractions of the uterus can occur throughout pregnancy. Contractions are not always a sign that you are in labor.  °WHAT ARE BRAXTON HICKS CONTRACTIONS?  °Contractions that occur before labor are called Braxton Hicks contractions, or false labor. Toward the end of pregnancy (32-34 weeks), these contractions can develop more often and may become more forceful. This is not true labor because these contractions do not result in opening (dilatation) and thinning of the cervix. They are sometimes difficult to tell apart from true labor because these contractions can be forceful and people have different pain tolerances. You should not feel embarrassed if you go to the hospital with false labor. Sometimes, the only way to tell if you are in true labor is for your health care provider to look for changes in the cervix. °If there are no prenatal problems or other health problems associated with the pregnancy, it is completely safe to be sent home with false labor and await the onset of true labor. °HOW CAN YOU TELL THE DIFFERENCE BETWEEN TRUE AND FALSE LABOR? °False Labor °· The contractions of false labor are usually shorter and not as hard as those of true labor.   °· The contractions are usually irregular.   °· The contractions are often felt in the front of the lower abdomen and in the groin.   °· The contractions may go away when you walk around or change positions while lying down.   °· The contractions get weaker and are shorter lasting as time goes on.   °· The contractions do not usually become progressively stronger, regular, and closer together as with true labor.   °True Labor °1. Contractions in true labor last 30-70 seconds, become very regular, usually become more intense, and increase in frequency.   °2. The contractions do not go away with walking.   °3. The discomfort is usually felt in the top of the uterus and spreads to the lower abdomen and low back.   °4. True labor can  be determined by your health care provider with an exam. This will show that the cervix is dilating and getting thinner.   °WHAT TO REMEMBER °· Keep up with your usual exercises and follow other instructions given by your health care provider.   °· Take medicines as directed by your health care provider.   °· Keep your regular prenatal appointments.   °· Eat and drink lightly if you think you are going into labor.   °· If Braxton Hicks contractions are making you uncomfortable:   °· Change your position from lying down or resting to walking, or from walking to resting.   °· Sit and rest in a tub of warm water.   °· Drink 2-3 glasses of water. Dehydration may cause these contractions.   °· Do slow and deep breathing several times an hour.   °WHEN SHOULD I SEEK IMMEDIATE MEDICAL CARE? °Seek immediate medical care if: °· Your contractions become stronger, more regular, and closer together.   °· You have fluid leaking or gushing from your vagina.   °· You have a fever.   °· You pass blood-tinged mucus.   °· You have vaginal bleeding.   °· You have continuous abdominal pain.   °· You have low back pain that you never had before.   °· You feel your baby's head pushing down and causing pelvic pressure.   °· Your baby is not moving as much as it used to.   °  °This information is not intended to replace advice given to you by your health care provider. Make sure you discuss any questions you have with your health care   provider. °  °Document Released: 09/24/2005 Document Revised: 09/29/2013 Document Reviewed: 07/06/2013 °Elsevier Interactive Patient Education ©2016 [employer] ° °Fetal Movement Counts °Patient Name: __________________________________________________ Patient Due Date: ____________________ °Performing a fetal movement count is highly recommended in high-risk pregnancies, but it is good for every pregnant woman to do. Your health care provider may ask you to start counting fetal movements at 28 weeks of the  pregnancy. Fetal movements often increase: °· After eating a full meal. °· After physical activity. °· After eating or drinking something sweet or cold. °· At rest. °Pay attention to when you feel the baby is most active. This will help you notice a pattern of your baby's sleep and wake cycles and what factors contribute to an increase in fetal movement. It is important to perform a fetal movement count at the same time each day when your baby is normally most active.  °HOW TO COUNT FETAL MOVEMENTS °5. Find a quiet and comfortable area to sit or lie down on your left side. Lying on your left side provides the best blood and oxygen circulation to your baby. °6. Write down the day and time on a sheet of paper or in a journal. °7. Start counting kicks, flutters, swishes, rolls, or jabs in a 2-hour period. You should feel at least 10 movements within 2 hours. °8. If you do not feel 10 movements in 2 hours, wait 2-3 hours and count again. Look for a change in the pattern or not enough counts in 2 hours. °SEEK MEDICAL CARE IF: °· You feel less than 10 counts in 2 hours, tried twice. °· There is no movement in over an hour. °· The pattern is changing or taking longer each day to reach 10 counts in 2 hours. °· You feel the baby is not moving as he or she usually does. °Date: ____________ Movements: ____________ Start time: ____________ Finish time: ____________  °Date: ____________ Movements: ____________ Start time: ____________ Finish time: ____________ °Date: ____________ Movements: ____________ Start time: ____________ Finish time: ____________ °Date: ____________ Movements: ____________ Start time: ____________ Finish time: ____________ °Date: ____________ Movements: ____________ Start time: ____________ Finish time: ____________ °Date: ____________ Movements: ____________ Start time: ____________ Finish time: ____________ °Date: ____________ Movements: ____________ Start time: ____________ Finish time:  ____________ °Date: ____________ Movements: ____________ Start time: ____________ Finish time: ____________  °Date: ____________ Movements: ____________ Start time: ____________ Finish time: ____________ °Date: ____________ Movements: ____________ Start time: ____________ Finish time: ____________ °Date: ____________ Movements: ____________ Start time: ____________ Finish time: ____________ °Date: ____________ Movements: ____________ Start time: ____________ Finish time: ____________ °Date: ____________ Movements: ____________ Start time: ____________ Finish time: ____________ °Date: ____________ Movements: ____________ Start time: ____________ Finish time: ____________ °Date: ____________ Movements: ____________ Start time: ____________ Finish time: ____________  °Date: ____________ Movements: ____________ Start time: ____________ Finish time: ____________ °Date: ____________ Movements: ____________ Start time: ____________ Finish time: ____________ °Date: ____________ Movements: ____________ Start time: ____________ Finish time: ____________ °Date: ____________ Movements: ____________ Start time: ____________ Finish time: ____________ °Date: ____________ Movements: ____________ Start time: ____________ Finish time: ____________ °Date: ____________ Movements: ____________ Start time: ____________ Finish time: ____________ °Date: ____________ Movements: ____________ Start time: ____________ Finish time: ____________  °Date: ____________ Movements: ____________ Start time: ____________ Finish time: ____________ °Date: ____________ Movements: ____________ Start time: ____________ Finish time: ____________ °Date: ____________ Movements: ____________ Start time: ____________ Finish time: ____________ °Date: ____________ Movements: ____________ Start time: ____________ Finish time: ____________ °Date: ____________ Movements: ____________ Start time: ____________ Finish time: ____________ °Date: ____________ Movements:  ____________ Start time: ____________ Finish   time: ____________ °Date: ____________ Movements: ____________ Start time: ____________ Finish time: ____________  °Date: ____________ Movements: ____________ Start time: ____________ Finish time: ____________ °Date: ____________ Movements: ____________ Start time: ____________ Finish time: ____________ °Date: ____________ Movements: ____________ Start time: ____________ Finish time: ____________ °Date: ____________ Movements: ____________ Start time: ____________ Finish time: ____________ °Date: ____________ Movements: ____________ Start time: ____________ Finish time: ____________ °Date: ____________ Movements: ____________ Start time: ____________ Finish time: ____________ °Date: ____________ Movements: ____________ Start time: ____________ Finish time: ____________  °Date: ____________ Movements: ____________ Start time: ____________ Finish time: ____________ °Date: ____________ Movements: ____________ Start time: ____________ Finish time: ____________ °Date: ____________ Movements: ____________ Start time: ____________ Finish time: ____________ °Date: ____________ Movements: ____________ Start time: ____________ Finish time: ____________ °Date: ____________ Movements: ____________ Start time: ____________ Finish time: ____________ °Date: ____________ Movements: ____________ Start time: ____________ Finish time: ____________ °Date: ____________ Movements: ____________ Start time: ____________ Finish time: ____________  °Date: ____________ Movements: ____________ Start time: ____________ Finish time: ____________ °Date: ____________ Movements: ____________ Start time: ____________ Finish time: ____________ °Date: ____________ Movements: ____________ Start time: ____________ Finish time: ____________ °Date: ____________ Movements: ____________ Start time: ____________ Finish time: ____________ °Date: ____________ Movements: ____________ Start time: ____________ Finish  time: ____________ °Date: ____________ Movements: ____________ Start time: ____________ Finish time: ____________ °Date: ____________ Movements: ____________ Start time: ____________ Finish time: ____________  °Date: ____________ Movements: ____________ Start time: ____________ Finish time: ____________ °Date: ____________ Movements: ____________ Start time: ____________ Finish time: ____________ °Date: ____________ Movements: ____________ Start time: ____________ Finish time: ____________ °Date: ____________ Movements: ____________ Start time: ____________ Finish time: ____________ °Date: ____________ Movements: ____________ Start time: ____________ Finish time: ____________ °Date: ____________ Movements: ____________ Start time: ____________ Finish time: ____________ °  °This information is not intended to replace advice given to you by your health care provider. Make sure you discuss any questions you have with your health care provider. °  °Document Released: 10/24/2006 Document Revised: 10/15/2014 Document Reviewed: 07/21/2012 °Elsevier Interactive Patient Education ©2016 [employer] Inc[employer] ° °

## 2015-10-30 NOTE — MAU Note (Signed)
Notified provider that patient presents with c/o decreased fetal movement for the past 2 days and sharp pelvic pain. Fetus reactive. Provider said she would come see the patient.

## 2015-11-03 ENCOUNTER — Encounter (HOSPITAL_COMMUNITY): Payer: Self-pay

## 2015-11-03 ENCOUNTER — Inpatient Hospital Stay (HOSPITAL_COMMUNITY)
Admission: AD | Admit: 2015-11-03 | Discharge: 2015-11-04 | DRG: 775 | Disposition: A | Discharge status: Home / Self Care | Payer: Federal, State, Local not specified - PPO | Source: Ambulatory Visit | Attending: Obstetrics | Admitting: Obstetrics

## 2015-11-03 ENCOUNTER — Other Ambulatory Visit: Payer: Self-pay | Admitting: Obstetrics

## 2015-11-03 DIAGNOSIS — Z3A40 40 weeks gestation of pregnancy: Secondary | ICD-10-CM

## 2015-11-03 DIAGNOSIS — Z87891 Personal history of nicotine dependence: Secondary | ICD-10-CM

## 2015-11-03 LAB — OB RESULTS CONSOLE ABO/RH: RH Type: POSITIVE

## 2015-11-03 LAB — OB RESULTS CONSOLE HIV ANTIBODY (ROUTINE TESTING): HIV: NONREACTIVE

## 2015-11-03 LAB — CBC
HEMATOCRIT: 33.9 % — AB (ref 36.0–46.0)
HEMOGLOBIN: 10.8 g/dL — AB (ref 12.0–15.0)
MCH: 27.6 pg (ref 26.0–34.0)
MCHC: 31.9 g/dL (ref 30.0–36.0)
MCV: 86.7 fL (ref 78.0–100.0)
Platelets: 251 10*3/uL (ref 150–400)
RBC: 3.91 MIL/uL (ref 3.87–5.11)
RDW: 16.6 % — AB (ref 11.5–15.5)
WBC: 8.6 10*3/uL (ref 4.0–10.5)

## 2015-11-03 LAB — OB RESULTS CONSOLE RUBELLA ANTIBODY, IGM: RUBELLA: IMMUNE

## 2015-11-03 LAB — OB RESULTS CONSOLE RPR: RPR: NONREACTIVE

## 2015-11-03 LAB — TYPE AND SCREEN
ABO/RH(D): B POS
ANTIBODY SCREEN: NEGATIVE

## 2015-11-03 LAB — OB RESULTS CONSOLE GBS: STREP GROUP B AG: NEGATIVE

## 2015-11-03 LAB — ABO/RH: ABO/RH(D): B POS

## 2015-11-03 LAB — OB RESULTS CONSOLE HEPATITIS B SURFACE ANTIGEN: Hepatitis B Surface Ag: NEGATIVE

## 2015-11-03 LAB — OB RESULTS CONSOLE GC/CHLAMYDIA
Chlamydia: NEGATIVE
GC PROBE AMP, GENITAL: NEGATIVE

## 2015-11-03 MED ORDER — PHENYLEPHRINE 40 MCG/ML (10ML) SYRINGE FOR IV PUSH (FOR BLOOD PRESSURE SUPPORT)
80.0000 ug | PREFILLED_SYRINGE | INTRAVENOUS | Status: DC | PRN
  Filled 2015-11-03: qty 20
  Filled 2015-11-03: qty 2

## 2015-11-03 MED ORDER — ACETAMINOPHEN 325 MG PO TABS: 650.0000 mg | ORAL_TABLET | ORAL | Status: DC | PRN

## 2015-11-03 MED ORDER — DIBUCAINE 1 % RE OINT: 1.0000 "application " | TOPICAL_OINTMENT | RECTAL | Status: DC | PRN

## 2015-11-03 MED ORDER — LACTATED RINGERS IV SOLN: 500.0000 mL | INTRAVENOUS | Status: DC | PRN

## 2015-11-03 MED ORDER — TETANUS-DIPHTH-ACELL PERTUSSIS 5-2.5-18.5 LF-MCG/0.5 IM SUSP
0.5000 mL | Freq: Once | INTRAMUSCULAR | Status: DC
Start: 2015-11-04 — End: 2015-11-04

## 2015-11-03 MED ORDER — OXYCODONE-ACETAMINOPHEN 5-325 MG PO TABS: 1.0000 | ORAL_TABLET | ORAL | Status: DC | PRN

## 2015-11-03 MED ORDER — WITCH HAZEL-GLYCERIN EX PADS: 1.0000 "application " | MEDICATED_PAD | CUTANEOUS | Status: DC | PRN

## 2015-11-03 MED ORDER — CITRIC ACID-SODIUM CITRATE 334-500 MG/5ML PO SOLN: 30.0000 mL | ORAL | Status: DC | PRN

## 2015-11-03 MED ORDER — SIMETHICONE 80 MG PO CHEW: 80.0000 mg | CHEWABLE_TABLET | ORAL | Status: DC | PRN

## 2015-11-03 MED ORDER — DIPHENHYDRAMINE HCL 25 MG PO CAPS: 25.0000 mg | ORAL_CAPSULE | Freq: Four times a day (QID) | ORAL | Status: DC | PRN

## 2015-11-03 MED ORDER — ONDANSETRON HCL 4 MG/2ML IJ SOLN: 4.0000 mg | INTRAMUSCULAR | Status: DC | PRN

## 2015-11-03 MED ORDER — LANOLIN HYDROUS EX OINT: TOPICAL_OINTMENT | CUTANEOUS | Status: DC | PRN

## 2015-11-03 MED ORDER — TERBUTALINE SULFATE 1 MG/ML IJ SOLN
0.2500 mg | Freq: Once | INTRAMUSCULAR | Status: DC | PRN
  Filled 2015-11-03: qty 1

## 2015-11-03 MED ORDER — LIDOCAINE HCL (PF) 1 % IJ SOLN
30.0000 mL | INTRAMUSCULAR | Status: DC | PRN
  Filled 2015-11-03: qty 30

## 2015-11-03 MED ORDER — OXYCODONE-ACETAMINOPHEN 5-325 MG PO TABS: 2.0000 | ORAL_TABLET | ORAL | Status: DC | PRN

## 2015-11-03 MED ORDER — FENTANYL 2.5 MCG/ML BUPIVACAINE 1/10 % EPIDURAL INFUSION (WH - ANES)
14.0000 mL/h | INTRAMUSCULAR | Status: DC | PRN
  Filled 2015-11-03: qty 125

## 2015-11-03 MED ORDER — EPHEDRINE 5 MG/ML INJ
10.0000 mg | INTRAVENOUS | Status: DC | PRN
  Filled 2015-11-03: qty 2

## 2015-11-03 MED ORDER — OXYTOCIN 10 UNIT/ML IJ SOLN: 2.5000 [IU]/h | Freq: Once | INTRAMUSCULAR | Status: DC | PRN

## 2015-11-03 MED ORDER — PRENATAL MULTIVITAMIN CH
1.0000 | ORAL_TABLET | Freq: Every day | ORAL | Status: DC
  Administered 2015-11-04: 1 via ORAL
  Filled 2015-11-03: qty 1

## 2015-11-03 MED ORDER — SENNOSIDES-DOCUSATE SODIUM 8.6-50 MG PO TABS
2.0000 | ORAL_TABLET | ORAL | Status: DC
  Administered 2015-11-03: 2 via ORAL
  Filled 2015-11-03: qty 2

## 2015-11-03 MED ORDER — OXYTOCIN BOLUS FROM INFUSION
500.0000 mL | Freq: Once | INTRAVENOUS | Status: DC | PRN
Start: 2015-11-03 — End: 2015-11-03

## 2015-11-03 MED ORDER — DIPHENHYDRAMINE HCL 50 MG/ML IJ SOLN: 12.5000 mg | INTRAMUSCULAR | Status: DC | PRN

## 2015-11-03 MED ORDER — ONDANSETRON HCL 4 MG/2ML IJ SOLN: 4.0000 mg | Freq: Four times a day (QID) | INTRAMUSCULAR | Status: DC | PRN

## 2015-11-03 MED ORDER — IBUPROFEN 600 MG PO TABS
600.0000 mg | ORAL_TABLET | Freq: Four times a day (QID) | ORAL | Status: DC
Start: 2015-11-03 — End: 2015-11-04
  Administered 2015-11-03 – 2015-11-04 (×4): 600 mg via ORAL
  Filled 2015-11-03 (×4): qty 1

## 2015-11-03 MED ORDER — OXYTOCIN 10 UNIT/ML IJ SOLN
1.0000 m[IU]/min | INTRAVENOUS | Status: DC
  Administered 2015-11-03: 2 m[IU]/min via INTRAVENOUS
  Filled 2015-11-03: qty 4

## 2015-11-03 MED ORDER — ZOLPIDEM TARTRATE 5 MG PO TABS: 5.0000 mg | ORAL_TABLET | Freq: Every evening | ORAL | Status: DC | PRN

## 2015-11-03 MED ORDER — BENZOCAINE-MENTHOL 20-0.5 % EX AERO: 1.0000 "application " | INHALATION_SPRAY | CUTANEOUS | Status: DC | PRN

## 2015-11-03 MED ORDER — ONDANSETRON HCL 4 MG PO TABS: 4.0000 mg | ORAL_TABLET | ORAL | Status: DC | PRN

## 2015-11-03 MED ORDER — LACTATED RINGERS IV SOLN
INTRAVENOUS | Status: DC
  Administered 2015-11-03: 13:00:00 via INTRAVENOUS

## 2015-11-03 NOTE — H&P (Signed)
Regina Weaver is a 25 y.o. G3P2002 at [redacted]w[redacted]d presenting for elective IOL. Pt notes rare contractions. Good fetal movement, No vaginal bleeding, not leaking fluid though some increase in d/c.   [facility] since 10 wks - uncomplicated preg - AFI 7% at 40 wks.    Prenatal Transfer Tool  Maternal Diabetes: No Genetic Screening: Normal Maternal Ultrasounds/Referrals: Normal Fetal Ultrasounds or other Referrals:  None Maternal Substance Abuse:  No Significant Maternal Medications:  None Significant Maternal Lab Results: None     OB History    Gravida Para Term Preterm AB TAB SAB Ectopic Multiple Living   History reviewed. No pertinent past medical history. Past Surgical History  Procedure Laterality Date  . Wisdom tooth extraction     Family History: family history is not on file. Social History:  reports that she has quit smoking. She has never used smokeless tobacco. She reports that she drinks alcohol. She reports that she does not use illicit drugs.  Review of Systems - Negative except discomfort of preg   Dilation: 5 Effacement (%): 50 Station: 0 Exam by:: Dr. Ernestina Penna Blood pressure 104/74, pulse 77, temperature 97.6 F (36.4 C), temperature source Oral, resp. rate 18, height 5' 2.5" (1.588 m), weight 79.833 kg (176 lb), last menstrual period 01/27/2015.  AROM- clear, slight blood tinge, small fluid return  Physical Exam:  Gen: well appearing, no distress  Abd: gravid, NT, no RUQ pain LE: trace edema, equal bilaterally, non-tender Toco: q 4-6, pit at 2 munits/ min FH: baseline 130s, accelerations present, no deceleratons, 10 beat variability  Prenatal labs: ABO, Rh: B/Positive/-- (01/26 0000) Antibody:  neg Rubella: !Error! immune RPR: Nonreactive (01/26 0000)  HBsAg: Negative (01/26 0000)  HIV: Non-reactive (01/26 0000)  GBS: Negative (01/26 0000)  1 hr Glucola 117  Genetic screening nl NT, nl AFP Anatomy US  normal   Assessment/Plan: 25 y.o. [id number] at [redacted]w[redacted]d Elective IOL. Low dose pitocin, AROM now, if not progress over next few hours will increase pitocin Reactive fetal testing Planning unmedicated birth   Regina Weaver A. 11/03/2015, 3:18 PM

## 2015-11-04 LAB — RPR: RPR Ser Ql: NONREACTIVE

## 2015-11-04 NOTE — Lactation Note (Signed)
This note was copied from the chart of Regina Weaver. Lactation Consultation Note  Patient Name: Regina Weaver Date: 11/04/2015 Reason for consult: Initial assessment   With this mom and term baby, now 73 hours old. The baby was latched and feeding in cross cradle when i walked in the room. The mom had to be reminded to hold her breast while feeding the baby, and to not unlatch the baby by making a space by the baby's nose. Mom does have flat nipples, but has the baby latched deeply. Basic teaching done on brestfeeding, and lactation services reviewed. Mom knows to call for questions/concerns.    Maternal Data Formula Feeding for Exclusion: No Has patient been taught Hand Expression?: Yes Does the patient have breastfeeding experience prior to this delivery?: Yes  Feeding Feeding Type: Breast Fed  LATCH Score/Interventions Latch: Grasps breast easily, tongue down, lips flanged, rhythmical sucking.  Audible Swallowing: A few with stimulation Intervention(s): Hand expression (esily expressed colostrum)  Type of Nipple: Flat  Comfort (Breast/Nipple): Soft / non-tender     Hold (Positioning): No assistance needed to correctly position infant at breast.  LATCH Score: 8  Lactation Tools Discussed/Used     Consult Status Consult Status: Complete Follow-up type: Call as needed    Alfred Levins 11/04/2015, 2:17 PM

## 2015-11-04 NOTE — Progress Notes (Signed)
Patient ID: Regina Weaver, female   DOB: 1991-07-07, 25 y.o.   MRN: [id number] PPD # 1 SVD  S:  Reports feeling well.             Tolerating po/ No nausea or vomiting             Bleeding is spotting             Pain controlled with ibuprofen (OTC)             Up ad lib / ambulatory / voiding without difficulties    Newborn  Information for the patient's newborn:  Miia, Blanks [[id number]]  female  breast feeding  / Circumcision planning   O:  A & O x 3, in no apparent distress              VS:  Filed Vitals:   11/03/15 1815 11/03/15 1930 11/03/15 2330 11/04/15 0615  BP: 120/57 112/54 97/54 122/76  Pulse: 66 63 81 64  Temp: 98.4 F (36.9 C) 98.3 F (36.8 C) 98.5 F (36.9 C) 97.9 F (36.6 C)  TempSrc: Oral Oral Oral Oral  Resp: Height:      Weight:        LABS:  Recent Labs  11/03/15 1353  WBC 8.6  HGB 10.8*  HCT 33.9*  PLT 251    Blood type: B POS (01/26 1353)  Rubella: Immune (01/26 0000)   I&O: I/O last 3 completed shifts: In: -  Out: 180 [Blood:180]             Lungs: Clear and unlabored  Heart: regular rate and rhythm / no murmurs  Abdomen: soft, non-tender, non-distended             Fundus: firm, non-tender, U-1  Perineum: intact, no edema  Lochia: scant  Extremities: No edema, no calf pain or tenderness, No Homans    A/P: PPD # 1  25 y.o., [id number]   Principal Problem:    Postpartum care following vaginal delivery (1/26)    Doing well - stable status  Routine post partum orders  Anticipate discharge tomorrow    Raelyn Mora, M, MSN, CNM 11/04/2015, 8:48 AM

## 2015-11-06 NOTE — Discharge Summary (Signed)
OB Discharge Summary  Patient Name: Regina Weaver DOB: Jul 11, 1991 MRN: [id number]  Date of admission: 11/03/2015 Delivering MD: Noland Fordyce   Date of discharge: 11/06/2015  Admitting diagnosis: INDUCTION, elective Intrauterine pregnancy: [redacted]w[redacted]d     Secondary diagnosis:Principal Problem:   Postpartum care following vaginal delivery (1/26) Active Problems:   Labor and delivery, indication for care   Normal vaginal delivery  Additional problems:none     Discharge diagnosis: Term Pregnancy Delivered                                                                     Post partum procedures:none  Augmentation: AROM and Pitocin  Complications: None  Hospital course:  Induction of Labor With Vaginal Delivery   25 y.o. yo G3P3001 at [redacted]w[redacted]d was admitted to the hospital 11/03/2015 for induction of labor.  Indication for induction: Favorable cervix at term.  Patient had an uncomplicated labor course as follows: Membrane Rupture Time/Date: 3:07 PM ,11/03/2015   Intrapartum Procedures: Episiotomy: None [1]                                         Lacerations:  None [1]  Patient had delivery of a Viable infant.  Information for the patient's newborn:  Lauris, Keepers [[id number]]  Delivery Method: Vaginal, Spontaneous Delivery (Filed from Delivery Summary)   11/03/2015  Details of delivery can be found in separate delivery note.  Patient had a routine postpartum course. Patient is discharged home 11/06/2015.   Physical exam  Filed Vitals:   11/03/15 1930 11/03/15 2330 11/04/15 0615 11/04/15 1700  BP: 112/54 97/54 122/76 111/70  Pulse: 63 81 64 62  Temp: 98.3 F (36.8 C) 98.5 F (36.9 C) 97.9 F (36.6 C) 98 F (36.7 C)  TempSrc: Oral Oral Oral Oral  Resp: Height:      Weight:       General: alert Lochia: appropriate Uterine Fundus: firm Incision: N/A DVT Evaluation: No evidence of DVT seen on physical exam. Labs: Lab Results  Component Value Date   WBC 8.6 11/03/2015   HGB 10.8* 11/03/2015   HCT 33.9* 11/03/2015   MCV 86.7 11/03/2015   PLT 251 11/03/2015   CMP Latest Ref Rng 02/10/2014  Glucose 70 - 99 mg/dL 782(N)  BUN 6 - 23 mg/dL 13  Creatinine 5.62 - 1.30 mg/dL 8.65  Sodium 784 - 696 mEq/L 139  Potassium 3.7 - 5.3 mEq/L 4.0  Chloride 96 - 112 mEq/L 103  CO2 19 - 32 mEq/L 20  Calcium 8.4 - 10.5 mg/dL 9.3  Total Protein 6.0 - 8.3 g/dL 7.9  Total Bilirubin 0.3 - 1.2 mg/dL 2.9(B)  Alkaline Phos 39 - 117 U/L 84  AST 0 - 37 U/L 16  ALT 0 - 35 U/L 14    Discharge instruction: per After Visit Summary and "Baby and Me Booklet".  After Visit Meds:    Medication List    Notice    You have not been prescribed any medications.      Diet: routine diet  Activity: Advance as tolerated. Pelvic rest for 6 weeks.  Outpatient follow up:6 weeks Follow up Appt:No future appointments. Follow up visit: No Follow-up on file.  Postpartum contraception: Undecided  Newborn Data: Live born female  Birth Weight: 7 lb 4.8 oz (3310 g) APGAR: 8, 9  Baby Feeding: Breast Disposition:home with mother   11/06/2015 Lendon Colonel., MD

## 2015-12-20 ENCOUNTER — Other Ambulatory Visit: Payer: Self-pay | Admitting: Obstetrics & Gynecology

## 2015-12-20 ENCOUNTER — Encounter (HOSPITAL_COMMUNITY): Payer: Self-pay | Admitting: *Deleted

## 2015-12-23 ENCOUNTER — Ambulatory Visit (HOSPITAL_COMMUNITY): Payer: Federal, State, Local not specified - PPO | Admitting: Anesthesiology

## 2015-12-23 ENCOUNTER — Encounter (HOSPITAL_COMMUNITY)
Admission: RE | Disposition: A | Discharge status: Home / Self Care | Payer: Self-pay | Source: Ambulatory Visit | Attending: Obstetrics & Gynecology

## 2015-12-23 ENCOUNTER — Ambulatory Visit (HOSPITAL_COMMUNITY)
Admission: RE | Admit: 2015-12-23 | Discharge: 2015-12-23 | Disposition: A | Discharge status: Home / Self Care | Payer: Federal, State, Local not specified - PPO | Source: Ambulatory Visit | Attending: Obstetrics & Gynecology | Admitting: Obstetrics & Gynecology

## 2015-12-23 DIAGNOSIS — Z87891 Personal history of nicotine dependence: Secondary | ICD-10-CM | POA: Diagnosis not present

## 2015-12-23 DIAGNOSIS — Z302 Encounter for sterilization: Secondary | ICD-10-CM | POA: Diagnosis present

## 2015-12-23 HISTORY — PX: LAPAROSCOPIC TUBAL LIGATION: SHX1937

## 2015-12-23 LAB — CBC
HCT: 41 % (ref 36.0–46.0)
HEMOGLOBIN: 13.7 g/dL (ref 12.0–15.0)
MCH: 28.5 pg (ref 26.0–34.0)
MCHC: 33.4 g/dL (ref 30.0–36.0)
MCV: 85.4 fL (ref 78.0–100.0)
PLATELETS: 288 10*3/uL (ref 150–400)
RBC: 4.8 MIL/uL (ref 3.87–5.11)
RDW: 16 % — AB (ref 11.5–15.5)
WBC: 10.8 10*3/uL — ABNORMAL HIGH (ref 4.0–10.5)

## 2015-12-23 LAB — PREGNANCY, URINE: PREG TEST UR: NEGATIVE

## 2015-12-23 SURGERY — LIGATION, FALLOPIAN TUBE, LAPAROSCOPIC
Anesthesia: General | Laterality: Bilateral

## 2015-12-23 MED ORDER — MIDAZOLAM HCL 2 MG/2ML IJ SOLN
INTRAMUSCULAR | Status: DC | PRN
  Administered 2015-12-23: 2 mg via INTRAVENOUS

## 2015-12-23 MED ORDER — ONDANSETRON HCL 4 MG/2ML IJ SOLN
INTRAMUSCULAR | Status: AC
  Filled 2015-12-23: qty 2

## 2015-12-23 MED ORDER — KETOROLAC TROMETHAMINE 30 MG/ML IJ SOLN
INTRAMUSCULAR | Status: AC
  Filled 2015-12-23: qty 1

## 2015-12-23 MED ORDER — SCOPOLAMINE 1 MG/3DAYS TD PT72
MEDICATED_PATCH | TRANSDERMAL | Status: AC
  Filled 2015-12-23: qty 1

## 2015-12-23 MED ORDER — SCOPOLAMINE 1 MG/3DAYS TD PT72
1.0000 | MEDICATED_PATCH | Freq: Once | TRANSDERMAL | Status: DC
  Administered 2015-12-23: 1.5 mg via TRANSDERMAL

## 2015-12-23 MED ORDER — ONDANSETRON HCL 4 MG/2ML IJ SOLN
INTRAMUSCULAR | Status: DC | PRN
  Administered 2015-12-23: 4 mg via INTRAVENOUS

## 2015-12-23 MED ORDER — PROMETHAZINE HCL 25 MG/ML IJ SOLN: 6.2500 mg | INTRAMUSCULAR | Status: DC | PRN

## 2015-12-23 MED ORDER — LIDOCAINE HCL (CARDIAC) 20 MG/ML IV SOLN
INTRAVENOUS | Status: AC
  Filled 2015-12-23: qty 5

## 2015-12-23 MED ORDER — KETOROLAC TROMETHAMINE 30 MG/ML IJ SOLN
INTRAMUSCULAR | Status: DC | PRN
  Administered 2015-12-23: 30 mg via INTRAVENOUS

## 2015-12-23 MED ORDER — SILVER NITRATE-POT NITRATE 75-25 % EX MISC
CUTANEOUS | Status: DC | PRN
  Administered 2015-12-23: 3

## 2015-12-23 MED ORDER — BUPIVACAINE HCL (PF) 0.25 % IJ SOLN
INTRAMUSCULAR | Status: DC | PRN
  Administered 2015-12-23: 10 mL
  Administered 2015-12-23: 7 mL

## 2015-12-23 MED ORDER — FENTANYL CITRATE (PF) 250 MCG/5ML IJ SOLN
INTRAMUSCULAR | Status: AC
  Filled 2015-12-23: qty 5

## 2015-12-23 MED ORDER — ROCURONIUM BROMIDE 100 MG/10ML IV SOLN
INTRAVENOUS | Status: DC | PRN
  Administered 2015-12-23: 30 mg via INTRAVENOUS

## 2015-12-23 MED ORDER — BUPIVACAINE HCL (PF) 0.25 % IJ SOLN
INTRAMUSCULAR | Status: AC
  Filled 2015-12-23: qty 30

## 2015-12-23 MED ORDER — PROPOFOL 10 MG/ML IV BOLUS
INTRAVENOUS | Status: DC | PRN
  Administered 2015-12-23: 130 mg via INTRAVENOUS

## 2015-12-23 MED ORDER — NEOSTIGMINE METHYLSULFATE 10 MG/10ML IV SOLN
INTRAVENOUS | Status: AC
  Filled 2015-12-23: qty 1

## 2015-12-23 MED ORDER — GLYCOPYRROLATE 0.2 MG/ML IJ SOLN
INTRAMUSCULAR | Status: AC
  Filled 2015-12-23: qty 3

## 2015-12-23 MED ORDER — FENTANYL CITRATE (PF) 100 MCG/2ML IJ SOLN
25.0000 ug | INTRAMUSCULAR | Status: DC | PRN
  Administered 2015-12-23: 25 ug via INTRAVENOUS

## 2015-12-23 MED ORDER — DEXAMETHASONE SODIUM PHOSPHATE 4 MG/ML IJ SOLN
INTRAMUSCULAR | Status: AC
  Filled 2015-12-23: qty 1

## 2015-12-23 MED ORDER — MIDAZOLAM HCL 2 MG/2ML IJ SOLN
INTRAMUSCULAR | Status: AC
  Filled 2015-12-23: qty 2

## 2015-12-23 MED ORDER — LACTATED RINGERS IV SOLN
INTRAVENOUS | Status: DC
  Administered 2015-12-23 (×2): via INTRAVENOUS

## 2015-12-23 MED ORDER — HYDROMORPHONE HCL 1 MG/ML IJ SOLN
INTRAMUSCULAR | Status: DC | PRN
  Administered 2015-12-23: 0.5 mg via INTRAVENOUS

## 2015-12-23 MED ORDER — DEXAMETHASONE SODIUM PHOSPHATE 10 MG/ML IJ SOLN
INTRAMUSCULAR | Status: DC | PRN
  Administered 2015-12-23: 4 mg via INTRAVENOUS

## 2015-12-23 MED ORDER — FENTANYL CITRATE (PF) 100 MCG/2ML IJ SOLN
INTRAMUSCULAR | Status: DC | PRN
  Administered 2015-12-23: 50 ug via INTRAVENOUS
  Administered 2015-12-23 (×2): 100 ug via INTRAVENOUS

## 2015-12-23 MED ORDER — HYDROMORPHONE HCL 1 MG/ML IJ SOLN
INTRAMUSCULAR | Status: AC
  Filled 2015-12-23: qty 1

## 2015-12-23 MED ORDER — FENTANYL CITRATE (PF) 100 MCG/2ML IJ SOLN
INTRAMUSCULAR | Status: AC
  Administered 2015-12-23: 25 ug via INTRAVENOUS
  Filled 2015-12-23: qty 2

## 2015-12-23 MED ORDER — LIDOCAINE HCL (CARDIAC) 20 MG/ML IV SOLN
INTRAVENOUS | Status: DC | PRN
  Administered 2015-12-23: 100 mg via INTRAVENOUS

## 2015-12-23 MED ORDER — NEOSTIGMINE METHYLSULFATE 10 MG/10ML IV SOLN
INTRAVENOUS | Status: DC | PRN
  Administered 2015-12-23: 4 mg via INTRAVENOUS

## 2015-12-23 MED ORDER — GLYCOPYRROLATE 0.2 MG/ML IJ SOLN
INTRAMUSCULAR | Status: AC
  Filled 2015-12-23: qty 1

## 2015-12-23 MED ORDER — GLYCOPYRROLATE 0.2 MG/ML IJ SOLN
INTRAMUSCULAR | Status: DC | PRN
  Administered 2015-12-23: .8 mg via INTRAVENOUS

## 2015-12-23 MED ORDER — PROPOFOL 10 MG/ML IV BOLUS
INTRAVENOUS | Status: AC
  Filled 2015-12-23: qty 20

## 2015-12-23 MED ORDER — ROCURONIUM BROMIDE 100 MG/10ML IV SOLN
INTRAVENOUS | Status: AC
  Filled 2015-12-23: qty 1

## 2015-12-23 MED ORDER — HYDROCODONE-IBUPROFEN 5-200 MG PO TABS: 1.0000 | ORAL_TABLET | Freq: Three times a day (TID) | ORAL | Status: AC | PRN

## 2015-12-23 SURGICAL SUPPLY — 27 items
APPLICATOR COTTON TIP 6IN STRL (MISCELLANEOUS) IMPLANT
CATH ROBINSON RED A/P 16FR (CATHETERS) ×3 IMPLANT
CLIP FILSHIE TUBAL LIGA STRL (Clip) ×3 IMPLANT
CLOTH BEACON ORANGE TIMEOUT ST (SAFETY) ×3 IMPLANT
DRSG COVADERM PLUS 2X2 (GAUZE/BANDAGES/DRESSINGS) ×6 IMPLANT
DRSG OPSITE POSTOP 3X4 (GAUZE/BANDAGES/DRESSINGS) ×3 IMPLANT
ELECT REM PT RETURN 9FT ADLT (ELECTROSURGICAL) ×3
ELECTRODE REM PT RTRN 9FT ADLT (ELECTROSURGICAL) ×1 IMPLANT
GLOVE BIO SURGEON STRL SZ 6.5 (GLOVE) ×2 IMPLANT
GLOVE BIO SURGEONS STRL SZ 6.5 (GLOVE) ×1
GLOVE BIOGEL PI IND STRL 7.0 (GLOVE) ×3 IMPLANT
GLOVE BIOGEL PI INDICATOR 7.0 (GLOVE) ×6
GOWN STRL REUS W/TWL LRG LVL3 (GOWN DISPOSABLE) ×6 IMPLANT
LIQUID BAND (GAUZE/BANDAGES/DRESSINGS) ×3 IMPLANT
PACK LAPAROSCOPY BASIN (CUSTOM PROCEDURE TRAY) ×3 IMPLANT
PAD OB MATERNITY 4.3X12.25 (PERSONAL CARE ITEMS) ×3 IMPLANT
PAD TRENDELENBURG POSITION (MISCELLANEOUS) ×3 IMPLANT
PENCIL BUTTON HOLSTER BLD 10FT (ELECTRODE) IMPLANT
SLEEVE XCEL OPT CAN 5 100 (ENDOMECHANICALS) IMPLANT
SUT MNCRL AB 4-0 PS2 18 (SUTURE) ×3 IMPLANT
SUT VIC AB 2-0 CT2 27 (SUTURE) ×6 IMPLANT
SUT VICRYL 0 UR6 27IN ABS (SUTURE) ×3 IMPLANT
TOWEL OR 17X24 6PK STRL BLUE (TOWEL DISPOSABLE) ×6 IMPLANT
TROCAR BALLN 12MMX100 BLUNT (TROCAR) ×3 IMPLANT
TROCAR XCEL NON-BLD 5MMX100MML (ENDOMECHANICALS) ×3 IMPLANT
WARMER LAPAROSCOPE (MISCELLANEOUS) ×3 IMPLANT
WATER STERILE IRR 1000ML POUR (IV SOLUTION) ×3 IMPLANT

## 2015-12-23 NOTE — Anesthesia Procedure Notes (Signed)
Procedure Name: Intubation Date/Time: 12/23/2015 11:00 AM Performed by: Elgie CongoMALINOVA, Denny Lave H Pre-anesthesia Checklist: Patient identified, Emergency Drugs available, Suction available and Patient being monitored Patient Re-evaluated:Patient Re-evaluated prior to inductionOxygen Delivery Method: Circle system utilized Preoxygenation: Pre-oxygenation with 100% oxygen Intubation Type: IV induction Ventilation: Mask ventilation without difficulty Laryngoscope Size: Mac and 3 Grade View: Grade I Tube type: Oral Tube size: 7.0 mm Number of attempts: 1 Airway Equipment and Method: Stylet Placement Confirmation: ETT inserted through vocal cords under direct vision,  positive ETCO2 and breath sounds checked- equal and bilateral Secured at: 20 cm Tube secured with: Tape Dental Injury: Teeth and Oropharynx as per pre-operative assessment

## 2015-12-23 NOTE — Discharge Summary (Signed)
  Physician Discharge Summary  Patient ID: Regina Weaver MRN: [id number] DOB/AGE: 10-26-1990 24 y.o.  Admit date: 12/23/2015 Discharge date: 12/23/2015  Admission Diagnoses: Desires Sterilization  Discharge Diagnoses: Desires Sterilization        Active Problems:   * No active hospital problems. *   Discharged Condition: good  Hospital Course: Outpatient  Consults: None  Treatments: surgery: Laparoscopic bilateral tubal sterilization with Filshie Clips  Disposition: 01-Home or Self Care     Medication List    TAKE these medications        hydrocodone-ibuprofen 5-200 MG tablet  Commonly known as:  VICOPROFEN  Take 1 tablet by mouth every 8 (eight) hours as needed for pain.     prenatal multivitamin Tabs tablet  Take 1 tablet by mouth daily at 12 noon.           Follow-up Information    Follow up with Laiden Milles,MARIE-LYNE, MD In 3 weeks.   Specialty:  Obstetrics and Gynecology   Contact information:   [address] [place] [address] [phone]       Signed: Genia DelLAVOIE,MARIE-LYNE, MD 12/23/2015, 12:13 PM

## 2015-12-23 NOTE — Transfer of Care (Signed)
Immediate Anesthesia Transfer of Care Note  Patient: Regina Weaver  Procedure(s) Performed: Procedure(s): LAPAROSCOPIC TUBAL LIGATION With Filshie Clips (Bilateral)  Patient Location: PACU  Anesthesia Type:General  Level of Consciousness: awake, alert  and oriented  Airway & Oxygen Therapy: Patient Spontanous Breathing and Patient connected to nasal cannula oxygen  Post-op Assessment: Report given to RN, Post -op Vital signs reviewed and stable and Patient moving all extremities  Post vital signs: Reviewed and stable  Last Vitals:  Filed Vitals:   12/23/15 1022 12/23/15 1205  BP:    Pulse:  73  Temp: 38.2 C 37.7 C  Resp:      Complications: No apparent anesthesia complications

## 2015-12-23 NOTE — Op Note (Signed)
12/23/2015  12:01 PM  PATIENT:  Regina Weaver  25 y.o. female  PRE-OPERATIVE DIAGNOSIS:  Desires Sterilization  POST-OPERATIVE DIAGNOSIS:  Desires Sterilization  PROCEDURE:  Procedure(s): LAPAROSCOPIC BILATERAL TUBAL STERILIZATION With Filshie Clips  SURGEON:  Surgeon(s): Genia DelMarie-Lyne Belkys Henault, MD  ASSISTANTS: none   ANESTHESIA:   general   PROCEDURE:  Under general anesthesia with laryngeal mask the patient is an lithotomy position.  She is prepped with DuraPrep on the abdomen and with Betadine on the suprapubic, vulvar and vaginal areas.  The bladder was catheterized. The speculum is inserted in the vagina and the uterus is cannulated with a 1 tooth tenaculum.  The speculum is removed.  At the abdomen, we infiltrate the subcutaneous tissue with Marcaine one quarter plain 7 cc at the infraumbilical area. A 1.5 cm incision is done with the scalpel at that level. The aponeurosis is grasped with cokers. The aponeurosis is opened with Mayo scissors under direct vision. The parietal peritoneum is opened bluntly with a finger. A pursestring stitch of Vicryl 0 was done on the aponeurosis. The Roseanne RenoHassan is inserted under direct vision at that level and a pelvic pneumoperitoneum was created with CO2.  The intra-abdominal and pelvic areas are visualized.  The liver is normal to inspection.  The bowels are normal in appearance.  In the pelvis the uterus is normal in shape and appearance, both tubes are normal with normal fimbriae bilaterally. Both ovaries are normal in size and appearance. No pathology is seen in the pelvis.  A Filshie clip was applied on the right tube at about 2 cm from the cornua, making sure to include the mesosalpinx.  The fimbriae were well identified distally.  We proceeded the same way on the left side, applying a Filshie clip at about 2 cm from the cornua, making sure to include the mesosalpinx. The fimbriae were well identified distally.  Hemostasis was adequate at all levels.  Pictures  were taken before and after the application of the clips.  A picture of the liver was also taken.  The laparoscopic instrument was removed.  The Roseanne RenoHassan was removed.  The CO2 was evacuated.  The pursestring stitch was attached at the aponeurosis.  The Monocryl 3-0 as used to close the adipose tissue.  We then used the same stitch to close the skin with a subcuticular stitch.  Dermabond was added, and a dressing.  Vaginally the cannula with the one tooth tenaculum was removed.  A figure-of-eight with Vicryl 2-0 was done at the site of the tenaculum on the anterior lip of the cervix to complete hemostasis.  All instruments were removed.  The patient was brought to recovery room in good and stable status.  ESTIMATED BLOOD LOSS:  25 CC   Intake/Output Summary (Last 24 hours) at 12/23/15 1201 Last data filed at 12/23/15 1144  Gross per 24 hour  Intake   1000 ml  Output     35 ml  Net    965 ml     BLOOD ADMINISTERED:none   LOCAL MEDICATIONS USED:  MARCAINE     SPECIMEN:  No Specimen  DISPOSITION OF SPECIMEN:  N/A  COUNTS:  YES  PLAN OF CARE: Transfer to PACU  Genia DelMarie-Lyne Neamiah Sciarra MD  12/23/15 at 12:01 pm

## 2015-12-23 NOTE — Anesthesia Preprocedure Evaluation (Addendum)
Anesthesia Evaluation  Patient identified by MRN, date of birth, ID band Patient awake    Reviewed: Allergy & Precautions, H&P , NPO status , Patient's Chart, lab work & pertinent test results  History of Anesthesia Complications Negative for: history of anesthetic complications  Airway Mallampati: II  TM Distance: >3 FB Neck ROM: full    Dental no notable dental hx.    Pulmonary neg pulmonary ROS, former smoker,   [facility] to cough. Non-productive. Pt feels well. Forced expiration yeilds no wheezing or rhonchi. Chest exam perfectly normal. Discussed low yield of cancelling BTL for cough; pt given that option. She wishes to proceed. Pulmonary exam normal breath sounds clear to auscultation       Cardiovascular negative cardio ROS Normal cardiovascular exam Rhythm:regular Rate:Normal     Neuro/Psych negative neurological ROS     GI/Hepatic negative GI ROS, Neg liver ROS,   Endo/Other  negative endocrine ROS  Renal/GU negative Renal ROS     Musculoskeletal   Abdominal   Peds  Hematology negative hematology ROS (+)   Anesthesia Other Findings   Reproductive/Obstetrics negative OB ROS                            Anesthesia Physical Anesthesia Plan  ASA: I  Anesthesia Plan: General   Post-op Pain Management:    Induction: Intravenous  Airway Management Planned: Oral ETT  Additional Equipment:   Intra-op Plan:   Post-operative Plan:   Informed Consent: I have reviewed the patients History and Physical, chart, labs and discussed the procedure including the risks, benefits and alternatives for the proposed anesthesia with the patient or authorized representative who has indicated his/her understanding and acceptance.   Dental Advisory Given  Plan Discussed with: Anesthesiologist, CRNA and Surgeon  Anesthesia Plan Comments:         Anesthesia Quick  Evaluation

## 2015-12-23 NOTE — Discharge Instructions (Addendum)
Laparoscopic Tubal Ligation, Care After °Refer to this sheet in the next few weeks. These instructions provide you with information about caring for yourself after your procedure. Your health care provider may also give you more specific instructions. Your treatment has been planned according to current medical practices, but problems sometimes occur. Call your health care provider if you have any problems or questions after your procedure. °WHAT TO EXPECT AFTER THE PROCEDURE °After your procedure, it is common to have: °· Sore throat. °· Soreness at the incision site. °· Mild cramping. °· Tiredness. °· Mild nausea or vomiting. °· Shoulder pain. °HOME CARE INSTRUCTIONS °· Rest for the remainder of the day. °· Take medicines only as directed by your health care provider. These include over-the-counter medicines and prescription medicines. Do not take aspirin, which can cause bleeding. °· Over the next few days, gradually return to your normal activities and your normal diet. °· Avoid sexual intercourse for 2 weeks or as directed by your health care provider. °· Do not use tampons, and do not douche. °· Do not drive or operate heavy machinery while taking pain medicine. °· Do not lift anything that is heavier than 5 lb (2.3 kg) for 2 weeks or as directed by your health care provider. °· Do not take baths. Take showers only. Ask your health care provider when you can start taking baths. °· Take your temperature twice each day and write it down. °· Try to have help for your household needs for the first 7-10 days. °· There are many different ways to close and cover an incision, including stitches (sutures), skin glue, and adhesive strips. Follow instructions from your health care provider about: °¨ Incision care. °¨ Bandage (dressing) changes and removal. °¨ Incision closure removal. °· Check your incision area every day for signs of infection. Watch for: °¨ Redness, swelling, or pain. °¨ Fluid, blood, or pus. °· Keep  all follow-up visits as directed by your health care provider. °SEEK MEDICAL CARE IF: °· You have redness, swelling, or increasing pain in your incision area. °· You have fluid, blood, or pus coming from your incision for longer than 1 day. °· You notice a bad smell coming from your incision or your dressing. °· The edges of your incision break open after the sutures have been removed. °· Your pain does not decrease after 2-3 days. °· You have a rash. °· You repeatedly become dizzy or light-headed. °· You have a reaction to your medicine. °· Your pain medicine is not helping. °· You are constipated. °SEEK IMMEDIATE MEDICAL CARE IF: °· You have a fever. °· You faint. °· You have increasing pain in your abdomen. °· You have severe pain in one or both of your shoulders. °· You have bleeding or drainage from your suture sites or your vagina after surgery. °· You have shortness of breath or have difficulty breathing. °· You have chest pain or leg pain. °· You have ongoing nausea, vomiting, or diarrhea. °  °This information is not intended to replace advice given to you by your health care provider. Make sure you discuss any questions you have with your health care provider. °  °Document Released: 04/13/2005 Document Revised: 02/08/2015 Document Reviewed: 01/05/2012 °Elsevier Interactive Patient Education ©2016 [employer] ° °

## 2015-12-23 NOTE — H&P (Signed)
Regina FannyGladys Weaver is an 25 y.o. female [id number]  Married  RP:  Desire for Sterilization  Pertinent Gynecological History: Menses: PP Contraception: abstinence PP Blood transfusions: none Sexually transmitted diseases: no past history Previous GYN Procedures: None  Last pap: normal OB History: G3P3  Menstrual History:  Patient's last menstrual period was 12/09/2015 (approximate).    Past Medical History  Diagnosis Date  . SVD (spontaneous vaginal delivery)     x 3    Past Surgical History  Procedure Laterality Date  . Wisdom tooth extraction      History reviewed. No pertinent family history.  Social History:  reports that she quit smoking about 7 years ago. Her smoking use included Cigarettes. She has a 1.25 pack-year smoking history. She has never used smokeless tobacco. She reports that she drinks alcohol. She reports that she does not use illicit drugs.  Allergies: No Known Allergies  Prescriptions prior to admission  Medication Sig Dispense Refill Last Dose  . Prenatal Vit-Fe Fumarate-FA (PRENATAL MULTIVITAMIN) TABS tablet Take 1 tablet by mouth daily at 12 noon.       ROS  neg  Height 5\' 2"  (1.575 m), weight 170 lb (77.111 kg), last menstrual period 12/09/2015, currently breastfeeding. Physical Exam  Normal PP exam [facility]  Assessment/Plan: Desire for Sterilization.  Certain of decision for a permanent/sterilization procedure.  Surgery/risks reviewed.  Informed consent obtained for LPS BT/S with clips.  Regina Weaver,Regina Weaver 12/23/2015, 9:22 AM

## 2015-12-24 NOTE — Anesthesia Postprocedure Evaluation (Signed)
Anesthesia Post Note  Patient: Regina Weaver  Procedure(s) Performed: Procedure(s) (LRB): LAPAROSCOPIC TUBAL LIGATION With Filshie Clips (Bilateral)  Patient location during evaluation: PACU Anesthesia Type: General Level of consciousness: sedated Pain management: satisfactory to patient Vital Signs Assessment: post-procedure vital signs reviewed and stable Respiratory status: spontaneous breathing Cardiovascular status: stable Anesthetic complications: no    Last Vitals:  Filed Vitals:   12/23/15 1400 12/23/15 1445  BP: 109/64 117/76  Pulse: 80 79  Temp: 37.3 C 36.5 C  Resp: 18 18    Last Pain:  Filed Vitals:   12/23/15 1507  PainSc: 2                  Alton Tremblay EDWARD

## 2015-12-25 ENCOUNTER — Emergency Department (HOSPITAL_COMMUNITY)
Admission: EM | Admit: 2015-12-25 | Discharge: 2015-12-25 | Disposition: A | Discharge status: Home / Self Care | Payer: Federal, State, Local not specified - PPO | Attending: Emergency Medicine | Admitting: Emergency Medicine

## 2015-12-25 ENCOUNTER — Encounter (HOSPITAL_COMMUNITY): Payer: Self-pay | Admitting: Emergency Medicine

## 2015-12-25 ENCOUNTER — Emergency Department (HOSPITAL_COMMUNITY): Payer: Federal, State, Local not specified - PPO

## 2015-12-25 DIAGNOSIS — J069 Acute upper respiratory infection, unspecified: Secondary | ICD-10-CM

## 2015-12-25 DIAGNOSIS — Z79899 Other long term (current) drug therapy: Secondary | ICD-10-CM | POA: Insufficient documentation

## 2015-12-25 DIAGNOSIS — R509 Fever, unspecified: Secondary | ICD-10-CM | POA: Diagnosis present

## 2015-12-25 DIAGNOSIS — R69 Illness, unspecified: Secondary | ICD-10-CM

## 2015-12-25 DIAGNOSIS — Z87891 Personal history of nicotine dependence: Secondary | ICD-10-CM | POA: Diagnosis not present

## 2015-12-25 DIAGNOSIS — J111 Influenza due to unidentified influenza virus with other respiratory manifestations: Secondary | ICD-10-CM | POA: Insufficient documentation

## 2015-12-25 DIAGNOSIS — B9789 Other viral agents as the cause of diseases classified elsewhere: Secondary | ICD-10-CM

## 2015-12-25 LAB — COMPREHENSIVE METABOLIC PANEL
ALBUMIN: 3.7 g/dL (ref 3.5–5.0)
ALK PHOS: 71 U/L (ref 38–126)
ALT: 72 U/L — ABNORMAL HIGH (ref 14–54)
ANION GAP: 11 (ref 5–15)
AST: 36 U/L (ref 15–41)
BILIRUBIN TOTAL: 0.5 mg/dL (ref 0.3–1.2)
BUN: 13 mg/dL (ref 6–20)
CALCIUM: 9.1 mg/dL (ref 8.9–10.3)
CO2: 21 mmol/L — ABNORMAL LOW (ref 22–32)
Chloride: 106 mmol/L (ref 101–111)
Creatinine, Ser: 0.75 mg/dL (ref 0.44–1.00)
GFR calc Af Amer: >60 mL/min (ref >60)
GFR calc non Af Amer: >60 mL/min (ref >60)
GLUCOSE: 98 mg/dL (ref 65–99)
Potassium: 3.8 mmol/L (ref 3.5–5.1)
Sodium: 138 mmol/L (ref 135–145)
TOTAL PROTEIN: 7.4 g/dL (ref 6.5–8.1)

## 2015-12-25 LAB — URINALYSIS, ROUTINE W REFLEX MICROSCOPIC
Bilirubin Urine: NEGATIVE
GLUCOSE, UA: NEGATIVE mg/dL
KETONES UR: 15 mg/dL — AB
Nitrite: NEGATIVE
PROTEIN: 30 mg/dL — AB
Specific Gravity, Urine: 1.039 — ABNORMAL HIGH (ref 1.005–1.030)
pH: 5.5 (ref 5.0–8.0)

## 2015-12-25 LAB — CBC WITH DIFFERENTIAL/PLATELET
BASOS ABS: 0 10*3/uL (ref 0.0–0.1)
BASOS PCT: 0 %
EOS ABS: 0 10*3/uL (ref 0.0–0.7)
EOS PCT: 0 %
HCT: 39.2 % (ref 36.0–46.0)
Hemoglobin: 12.8 g/dL (ref 12.0–15.0)
Lymphocytes Relative: 21 %
Lymphs Abs: 1.4 10*3/uL (ref 0.7–4.0)
MCH: 28 pg (ref 26.0–34.0)
MCHC: 32.7 g/dL (ref 30.0–36.0)
MCV: 85.8 fL (ref 78.0–100.0)
MONO ABS: 0.5 10*3/uL (ref 0.1–1.0)
Monocytes Relative: 8 %
Neutro Abs: 4.7 10*3/uL (ref 1.7–7.7)
Neutrophils Relative %: 71 %
PLATELETS: 236 10*3/uL (ref 150–400)
RBC: 4.57 MIL/uL (ref 3.87–5.11)
RDW: 15.8 % — AB (ref 11.5–15.5)
WBC: 6.6 10*3/uL (ref 4.0–10.5)

## 2015-12-25 LAB — URINE MICROSCOPIC-ADD ON

## 2015-12-25 LAB — I-STAT CG4 LACTIC ACID, ED
Lactic Acid, Venous: 0.74 mmol/L (ref 0.5–2.0)
Lactic Acid, Venous: 1.02 mmol/L (ref 0.5–2.0)

## 2015-12-25 LAB — RAPID STREP SCREEN (MED CTR MEBANE ONLY): Streptococcus, Group A Screen (Direct): NEGATIVE

## 2015-12-25 MED ORDER — DIPHENHYDRAMINE HCL 50 MG/ML IJ SOLN
25.0000 mg | Freq: Once | INTRAMUSCULAR | Status: AC
  Administered 2015-12-25: 25 mg via INTRAVENOUS
  Filled 2015-12-25: qty 1

## 2015-12-25 MED ORDER — SODIUM CHLORIDE 0.9 % IV BOLUS (SEPSIS)
1000.0000 mL | Freq: Once | INTRAVENOUS | Status: AC
  Administered 2015-12-25: 1000 mL via INTRAVENOUS

## 2015-12-25 MED ORDER — PROMETHAZINE-DM 6.25-15 MG/5ML PO SYRP: 5.0000 mL | ORAL_SOLUTION | Freq: Four times a day (QID) | ORAL | Status: AC | PRN

## 2015-12-25 MED ORDER — IBUPROFEN 800 MG PO TABS: 800.0000 mg | ORAL_TABLET | Freq: Three times a day (TID) | ORAL | Status: DC | PRN

## 2015-12-25 MED ORDER — ACETAMINOPHEN 325 MG PO TABS
650.0000 mg | ORAL_TABLET | Freq: Once | ORAL | Status: AC
  Administered 2015-12-25: 650 mg via ORAL

## 2015-12-25 MED ORDER — ACETAMINOPHEN 325 MG PO TABS
ORAL_TABLET | ORAL | Status: AC
  Filled 2015-12-25: qty 2

## 2015-12-25 MED ORDER — GUAIFENESIN ER 1200 MG PO TB12: 1.0000 | ORAL_TABLET | Freq: Two times a day (BID) | ORAL | Status: AC

## 2015-12-25 NOTE — ED Provider Notes (Signed)
CSN: [id number]     Arrival date & time 12/25/15  1107 History   First MD Initiated Contact with Patient 12/25/15 1523     Chief Complaint  Patient presents with  . Fever  . Urticaria  . Post-op Problem     (Consider location/radiation/quality/duration/timing/severity/associated sxs/prior Treatment) HPI Patient presents to the emergency department with fever, rash, cough, sore throat, nasal congestion for the last 2 days.  The patient states she had a tube ligation on Friday with no complications.  The patient states that started developing these upper rest her symptoms with fever Saturday and they have progressed since that time she has also had this rash that is urticarial on her bodyThe patient denies chest pain, shortness of breath, headache,blurred vision, neck pain, weakness, numbness, dizziness, anorexia, edema, abdominal pain, nausea, vomiting, diarrhea, back pain, dysuria, hematemesis, bloody stool, near syncope, or syncope. Past Medical History  Diagnosis Date  . SVD (spontaneous vaginal delivery)     x 3   Past Surgical History  Procedure Laterality Date  . Wisdom tooth extraction     No family history on file. Social History  Substance Use Topics  . Smoking status: Former Smoker -- 0.25 packs/day for 5 years    Types: Cigarettes    Quit date: 10/08/2008  . Smokeless tobacco: Never Used  . Alcohol Use: Yes     Comment: occasional mixed drink   OB History    Gravida Para Term Preterm AB TAB SAB Ectopic Multiple Living   0 1     Review of Systems   All other systems negative except as documented in the HPI. All pertinent positives and negatives as reviewed in the HPI. Allergies  Review of patient's allergies indicates no known allergies.  Home Medications   Prior to Admission medications   Medication Sig Start Date End Date Taking? Authorizing Provider  hydrocodone-ibuprofen (VICOPROFEN) 5-200 MG tablet Take 1 tablet by mouth every 8 (eight) hours  as needed for pain. 12/23/15  Yes Genia Del, MD  Prenatal Vit-Fe Fumarate-FA (PRENATAL MULTIVITAMIN) TABS tablet Take 1 tablet by mouth daily at 12 noon.   Yes Historical Provider, MD   BP 122/78 mmHg  Pulse 87  Temp(Src) 98.5 F (36.9 C) (Oral)  Resp 18  Ht  (1.575 m)  SpO2 100%  LMP 12/09/2015 (Approximate)  Breastfeeding? Yes Physical Exam  Constitutional: She is oriented to person, place, and time. She appears well-developed and well-nourished. No distress.  HENT:  Head: Normocephalic and atraumatic.  Mouth/Throat: Oropharynx is clear and moist.  Eyes: Pupils are equal, round, and reactive to light.  Neck: Normal range of motion. Neck supple.  Cardiovascular: Normal rate, regular rhythm and normal heart sounds.  Exam reveals no gallop and no friction rub.   No murmur heard. Pulmonary/Chest: Effort normal and breath sounds normal. No respiratory distress. She has no wheezes.  Abdominal: Soft. Bowel sounds are normal. She exhibits no distension. There is no tenderness. There is no rigidity, no rebound and no guarding.    Neurological: She is alert and oriented to person, place, and time. She exhibits normal muscle tone. Coordination normal.  Skin: Skin is warm and dry. No rash noted. No erythema.  Psychiatric: She has a normal mood and affect. Her behavior is normal.  Nursing note and vitals reviewed.   ED Course  Procedures (including critical care time) Labs Review Labs Reviewed  COMPREHENSIVE METABOLIC PANEL - Abnormal; Notable for the following:  CO2 21 (*)    ALT 72 (*)    All other components within normal limits  URINALYSIS, ROUTINE W REFLEX MICROSCOPIC (NOT AT [facility]) - Abnormal; Notable for the following:    Color, Urine AMBER (*)    APPearance CLOUDY (*)    Specific Gravity, Urine 1.039 (*)    Hgb urine dipstick LARGE (*)    Ketones, ur 15 (*)    Protein, ur 30 (*)    Leukocytes, UA SMALL (*)    All other components within normal limits  CBC  WITH DIFFERENTIAL/PLATELET - Abnormal; Notable for the following:    RDW 15.8 (*)    All other components within normal limits  URINE MICROSCOPIC-ADD ON - Abnormal; Notable for the following:    Squamous Epithelial / LPF 6-30 (*)    Bacteria, UA FEW (*)    All other components within normal limits  RAPID STREP SCREEN (NOT AT [facility])  CULTURE, BLOOD (ROUTINE X 2)  CULTURE, BLOOD (ROUTINE X 2)  URINE CULTURE  CULTURE, GROUP A STREP ([facility])  I-STAT CG4 LACTIC ACID, ED  I-STAT CG4 LACTIC ACID, ED    Imaging Review Dg Chest 2 View  12/25/2015  CLINICAL DATA:  Patient states she had a tube ligation on Friday, broke out with one hive the day of surgery and it went away, she started to run a fever of 102 and broke out with hives on her arms and where the surgery site is yesterday. EXAM: CHEST  2 VIEW COMPARISON:  10/09/2010 FINDINGS: Heart size is normal. The lungs are free of focal consolidations no pleural effusions. No pulmonary edema. Note is made of a small amount of free intraperitoneal air following recent laparoscopy. IMPRESSION: 1.  No evidence for acute cardiopulmonary abnormality. 2. Small amount of free air consistent with recent surgery. Electronically Signed   By: Norva PavlovElizabeth  Brown M.D.   On: 12/25/2015 13:42   I have personally reviewed and evaluated these images and lab results as part of my medical decision-making.  Patient be treated for a viral upper respiratory illness.  Based on her symptoms, cough, nasal congestion with fever.  The patient is advised follow-up with her GYN doctor.  Her abdominal incision sites do not appear infected.  She does not have any guarding or rigidity    Charlestine NightChristopher Remiel Corti, PA-C 12/25/15 2011  Linwood DibblesJon Knapp, MD 12/25/15 2023

## 2015-12-25 NOTE — ED Notes (Signed)
Pt reports woke up Friday with pink patchy rash. Pt had a tubal ligation done Friday and afterwards began to have hives all over her body.

## 2015-12-25 NOTE — Discharge Instructions (Signed)
Return here as needed.  Follow-up with your GYN doctor.  This is most likely an upper respiratory illness

## 2015-12-26 ENCOUNTER — Encounter (HOSPITAL_COMMUNITY): Payer: Self-pay | Admitting: Obstetrics & Gynecology

## 2015-12-26 LAB — URINE CULTURE

## 2015-12-28 LAB — CULTURE, GROUP A STREP (THRC)

## 2015-12-30 LAB — CULTURE, BLOOD (ROUTINE X 2)
CULTURE: NO GROWTH
Culture: NO GROWTH

## 2017-08-13 ENCOUNTER — Emergency Department (HOSPITAL_BASED_OUTPATIENT_CLINIC_OR_DEPARTMENT_OTHER): Payer: No Typology Code available for payment source

## 2017-08-13 ENCOUNTER — Emergency Department (HOSPITAL_BASED_OUTPATIENT_CLINIC_OR_DEPARTMENT_OTHER)
Admission: EM | Admit: 2017-08-13 | Discharge: 2017-08-13 | Disposition: A | Discharge status: Home / Self Care | Payer: No Typology Code available for payment source | Attending: Emergency Medicine | Admitting: Emergency Medicine

## 2017-08-13 ENCOUNTER — Encounter (HOSPITAL_BASED_OUTPATIENT_CLINIC_OR_DEPARTMENT_OTHER): Payer: Self-pay | Admitting: *Deleted

## 2017-08-13 ENCOUNTER — Other Ambulatory Visit: Payer: Self-pay

## 2017-08-13 DIAGNOSIS — R0789 Other chest pain: Secondary | ICD-10-CM

## 2017-08-13 DIAGNOSIS — Y9389 Activity, other specified: Secondary | ICD-10-CM | POA: Diagnosis not present

## 2017-08-13 DIAGNOSIS — Y929 Unspecified place or not applicable: Secondary | ICD-10-CM | POA: Insufficient documentation

## 2017-08-13 DIAGNOSIS — Z87891 Personal history of nicotine dependence: Secondary | ICD-10-CM | POA: Diagnosis not present

## 2017-08-13 DIAGNOSIS — Y998 Other external cause status: Secondary | ICD-10-CM | POA: Insufficient documentation

## 2017-08-13 DIAGNOSIS — M25562 Pain in left knee: Secondary | ICD-10-CM | POA: Diagnosis not present

## 2017-08-13 MED ORDER — IBUPROFEN 800 MG PO TABS: 800.0000 mg | ORAL_TABLET | Freq: Three times a day (TID) | ORAL | 0 refills | Status: AC | PRN
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
## 2017-08-13 NOTE — Discharge Instructions (Signed)
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
## 2017-11-03 ENCOUNTER — Emergency Department (HOSPITAL_COMMUNITY): Payer: Self-pay

## 2017-11-03 ENCOUNTER — Encounter (HOSPITAL_COMMUNITY): Payer: Self-pay

## 2017-11-03 ENCOUNTER — Emergency Department (HOSPITAL_COMMUNITY)
Admission: EM | Admit: 2017-11-03 | Discharge: 2017-11-03 | Disposition: A | Discharge status: Home / Self Care | Payer: Self-pay | Attending: Emergency Medicine | Admitting: Emergency Medicine

## 2017-11-03 DIAGNOSIS — B349 Viral infection, unspecified: Secondary | ICD-10-CM | POA: Insufficient documentation

## 2017-11-03 DIAGNOSIS — Z79899 Other long term (current) drug therapy: Secondary | ICD-10-CM | POA: Insufficient documentation

## 2017-11-03 DIAGNOSIS — R05 Cough: Secondary | ICD-10-CM | POA: Insufficient documentation

## 2017-11-03 DIAGNOSIS — R112 Nausea with vomiting, unspecified: Secondary | ICD-10-CM | POA: Insufficient documentation

## 2017-11-03 DIAGNOSIS — Z87891 Personal history of nicotine dependence: Secondary | ICD-10-CM | POA: Insufficient documentation

## 2017-11-03 DIAGNOSIS — L509 Urticaria, unspecified: Secondary | ICD-10-CM | POA: Insufficient documentation

## 2017-11-03 DIAGNOSIS — R509 Fever, unspecified: Secondary | ICD-10-CM

## 2017-11-03 LAB — COMPREHENSIVE METABOLIC PANEL
ALT: 18 U/L (ref 14–54)
AST: 23 U/L (ref 15–41)
Albumin: 4.1 g/dL (ref 3.5–5.0)
Alkaline Phosphatase: 69 U/L (ref 38–126)
Anion gap: 13 (ref 5–15)
BILIRUBIN TOTAL: 0.5 mg/dL (ref 0.3–1.2)
BUN: 12 mg/dL (ref 6–20)
CHLORIDE: 106 mmol/L (ref 101–111)
CO2: 17 mmol/L — ABNORMAL LOW (ref 22–32)
CREATININE: 0.89 mg/dL (ref 0.44–1.00)
Calcium: 9 mg/dL (ref 8.9–10.3)
Glucose, Bld: 112 mg/dL — ABNORMAL HIGH (ref 65–99)
Potassium: 3.9 mmol/L (ref 3.5–5.1)
Sodium: 136 mmol/L (ref 135–145)
TOTAL PROTEIN: 7.5 g/dL (ref 6.5–8.1)

## 2017-11-03 LAB — I-STAT BETA HCG BLOOD, ED (MC, WL, AP ONLY)

## 2017-11-03 LAB — CBC
HCT: 46.6 % — ABNORMAL HIGH (ref 36.0–46.0)
Hemoglobin: 15.8 g/dL — ABNORMAL HIGH (ref 12.0–15.0)
MCH: 30.7 pg (ref 26.0–34.0)
MCHC: 33.9 g/dL (ref 30.0–36.0)
MCV: 90.5 fL (ref 78.0–100.0)
PLATELETS: 230 10*3/uL (ref 150–400)
RBC: 5.15 MIL/uL — AB (ref 3.87–5.11)
RDW: 13.6 % (ref 11.5–15.5)
WBC: 8.1 10*3/uL (ref 4.0–10.5)

## 2017-11-03 LAB — RAPID STREP SCREEN (MED CTR MEBANE ONLY): Streptococcus, Group A Screen (Direct): NEGATIVE

## 2017-11-03 LAB — LIPASE, BLOOD: LIPASE: 28 U/L (ref 11–51)

## 2017-11-03 MED ORDER — SODIUM CHLORIDE 0.9 % IV BOLUS (SEPSIS)
1000.0000 mL | Freq: Once | INTRAVENOUS | Status: AC
  Administered 2017-11-03: 1000 mL via INTRAVENOUS

## 2017-11-03 MED ORDER — PREDNISONE 20 MG PO TABS: 40.0000 mg | ORAL_TABLET | Freq: Every day | ORAL | 0 refills | Status: DC

## 2017-11-03 MED ORDER — DIPHENHYDRAMINE HCL 50 MG/ML IJ SOLN
25.0000 mg | Freq: Once | INTRAMUSCULAR | Status: AC
  Administered 2017-11-03: 25 mg via INTRAVENOUS
  Filled 2017-11-03: qty 1

## 2017-11-03 MED ORDER — METHYLPREDNISOLONE SODIUM SUCC 125 MG IJ SOLR
125.0000 mg | Freq: Once | INTRAMUSCULAR | Status: AC
  Administered 2017-11-03: 125 mg via INTRAVENOUS
  Filled 2017-11-03: qty 2

## 2017-11-03 MED ORDER — ONDANSETRON HCL 4 MG/2ML IJ SOLN
4.0000 mg | Freq: Once | INTRAMUSCULAR | Status: AC
  Administered 2017-11-03: 4 mg via INTRAVENOUS
  Filled 2017-11-03: qty 2

## 2017-11-03 MED ORDER — ONDANSETRON 4 MG PO TBDP: 4.0000 mg | ORAL_TABLET | Freq: Three times a day (TID) | ORAL | 0 refills | Status: AC | PRN

## 2017-11-03 NOTE — ED Notes (Signed)
Pt ambulated to hallway bed, tolerated well.

## 2017-11-03 NOTE — ED Notes (Signed)
Patient given discharge instructions and verbalized understanding.  Patient stable to discharge at this time.  Patient is alert and oriented to baseline.  No distressed noted at this time.  All belongings taken with the patient at discharge.   

## 2017-11-03 NOTE — ED Triage Notes (Signed)
Patient complains of cough, abdominal pain with general body aches, vomiting and diarrhea x 2 days. States fever with same. Alert and oriented, NAD

## 2017-11-03 NOTE — Discharge Instructions (Signed)
It was my pleasure taking care of you today!  Zofran as needed for nausea.  Alternate Tylenol and Ibuprofen as needed for fever and/or body aches.  Benadryl every hour hours as needed for itching / hives. Prednisone daily (starting tomorrow) for hives as well.  Increase hydration.   Follow up with your primary care provider if symptoms do not improve in 2-3 days.   Return to ER for new or worsening symptoms, any additional concerns.

## 2017-11-03 NOTE — ED Provider Notes (Signed)
[facility] EMERGENCY DEPARTMENT Provider Note   CSN: [id number] Arrival date & time: 11/03/17  1225     History   Chief Complaint Chief Complaint  Patient presents with  . bodyaches, fever, vomiting    HPI Regina Weaver is a 27 y.o. female.  The history is provided by the patient and medical records. No language interpreter was used.     Regina Weaver is an otherwise healthy 27 y.o. female who presents to the Emergency Department complaining of subjective fever, productive cough, nasal congestion and generalized body aches times 3 days.  Last night, she began having epigastric abdominal pain,  diarrhea and has had 4 episodes of emesis.  She reports hives to the scalp which began today as well.  She reports history of urticaria which typically develops if she is under stress or ill.  She denies any difficulty breathing or trouble swallowing. She has been able to keep fluids down today, but has not tried to eat anything. She has tried nasal saline spray and ibuprofen with mild relief.   Past Medical History:  Diagnosis Date  . SVD (spontaneous vaginal delivery)    x 3    Patient Active Problem List   Diagnosis Date Noted  . Labor and delivery, indication for care 11/03/2015  . Normal vaginal delivery 11/03/2015  . Postpartum care following vaginal delivery (1/26) 11/03/2015    Past Surgical History:  Procedure Laterality Date  . LAPAROSCOPIC TUBAL LIGATION Bilateral 12/23/2015   Procedure: LAPAROSCOPIC TUBAL LIGATION With Filshie Clips;  Surgeon: Genia Del, MD;  Location: [facility];  Service: Gynecology;  Laterality: Bilateral;  . WISDOM TOOTH EXTRACTION      OB History    Gravida Para Term Preterm AB Living   3 3 3     1    SAB TAB Ectopic Multiple Live Births         0 1       Home Medications    Prior to Admission medications   Medication Sig Start Date End Date Taking? Authorizing Provider  Guaifenesin 1200 MG TB12 Take 1 tablet (1,200  mg total) by mouth 2 (two) times daily. 12/25/15   Lawyer, Cristal Deer, PA-C  hydrocodone-ibuprofen (VICOPROFEN) 5-200 MG tablet Take 1 tablet by mouth every 8 (eight) hours as needed for pain. 12/23/15   Genia Del, MD  ibuprofen (ADVIL,MOTRIN) 800 MG tablet Take 1 tablet (800 mg total) every 8 (eight) hours as needed by mouth. 08/13/17   Long, Arlyss Repress, MD  ondansetron (ZOFRAN ODT) 4 MG disintegrating tablet Take 1 tablet (4 mg total) by mouth every 8 (eight) hours as needed for nausea or vomiting. 11/03/17   Anay Rathe, Chase Picket, PA-C  predniSONE (DELTASONE) 20 MG tablet Take 2 tablets (40 mg total) by mouth daily. 11/03/17   Icy Fuhrmann, Chase Picket, PA-C  Prenatal Vit-Fe Fumarate-FA (PRENATAL MULTIVITAMIN) TABS tablet Take 1 tablet by mouth daily at 12 noon.    [provider]  promethazine-dextromethorphan (PROMETHAZINE-DM) 6.25-15 MG/5ML syrup Take 5 mLs by mouth 4 (four) times daily as needed for cough. 12/25/15   Lawyer, Cristal Deer, PA-C  furosemide (LASIX) 20 MG tablet Take 20 mg by mouth daily as needed for fluid.  02/10/14  [provider]    Family History No family history on file.  Social History Social History   Tobacco Use  . Smoking status: Former Smoker    Packs/day: 0.25    Years: 5.00    Pack years: 1.25    Types: Cigarettes  Last attempt to quit: 10/08/2008    Years since quitting: 9.0  . Smokeless tobacco: Never Used  Substance Use Topics  . Alcohol use: Yes    Comment: occasional mixed drink  . Drug use: No     Allergies   Patient has no known allergies.   Review of Systems Review of Systems  Constitutional: Positive for chills and fever.  HENT: Positive for congestion and sore throat. Negative for trouble swallowing.   Respiratory: Positive for cough. Negative for shortness of breath.   Cardiovascular: Negative for chest pain.  Gastrointestinal: Positive for abdominal pain, diarrhea, nausea and vomiting.  Genitourinary: Negative for  dysuria, frequency and urgency.  Skin: Positive for rash (Urticaria).  All other systems reviewed and are negative.    Physical Exam Updated Vital Signs BP 134/82 (BP Location: Right Arm)   Pulse (!) 118   Temp 99.6 F (37.6 C) (Oral)   Resp 16   Ht 5\' 2"  (1.575 m)   Wt 72.6 kg (160 lb)   SpO2 100%   BMI 29.26 kg/m   Physical Exam  Constitutional: She is oriented to person, place, and time. She appears well-developed and well-nourished. No distress.  HENT:  Head: Normocephalic and atraumatic.  Airway patent.  Oropharynx with erythema, but no tonsillar hypertrophy or exudates.  Urticarial rash to the scalp / forehead.  Cardiovascular: Normal heart sounds.  No murmur heard. Tachycardic but regular  Pulmonary/Chest: Effort normal and breath sounds normal. No stridor. No respiratory distress. She has no wheezes.  Abdominal: Soft. She exhibits no distension.  Diffuse abdominal tenderness.  No rebound or guarding.  Negative Murphy's.  No focal tenderness at McBurney's point.  Musculoskeletal: She exhibits no edema.  Neurological: She is alert and oriented to person, place, and time.  Skin: Skin is warm and dry.  Nursing note and vitals reviewed.    ED Treatments / Results  Labs (all labs ordered are listed, but only abnormal results are displayed) Labs Reviewed  COMPREHENSIVE METABOLIC PANEL - Abnormal; Notable for the following components:      Result Value   CO2 17 (*)    Glucose, Bld 112 (*)    All other components within normal limits  CBC - Abnormal; Notable for the following components:   RBC 5.15 (*)    Hemoglobin 15.8 (*)    HCT 46.6 (*)    All other components within normal limits  RAPID STREP SCREEN (NOT [facility])  CULTURE, GROUP A STREP ([facility])  LIPASE, BLOOD  URINALYSIS, ROUTINE W REFLEX MICROSCOPIC  I-STAT BETA HCG BLOOD, ED ([facility], [facility], [facility] ONLY)    EKG  EKG Interpretation None       Radiology Dg Chest 2 View  Result Date: 11/03/2017 CLINICAL DATA:   Cough and fever for 2 days EXAM: CHEST  2 VIEW COMPARISON:  08/13/2017 FINDINGS: The heart size and mediastinal contours are within normal limits. Both lungs are clear. The visualized skeletal structures are unremarkable. IMPRESSION: No active cardiopulmonary disease. Electronically Signed   By: Alcide CleverMark  Lukens M.D.   On: 11/03/2017 15:39    Procedures Procedures (including critical care time)  Medications Ordered in ED Medications  sodium chloride 0.9 % bolus 1,000 mL (1,000 mLs Intravenous New Bag/Given 11/03/17 1512)  diphenhydrAMINE (BENADRYL) injection 25 mg (25 mg Intravenous Given 11/03/17 1516)  methylPREDNISolone sodium succinate (SOLU-MEDROL) 125 mg/2 mL injection 125 mg (125 mg Intravenous Given 11/03/17 1516)  ondansetron (ZOFRAN) injection 4 mg (4 mg Intravenous Given 11/03/17 1514)  Initial Impression / Assessment and Plan / ED Course  I have reviewed the triage vital signs and the nursing notes.  Pertinent labs & imaging results that were available during my care of the patient were reviewed by me and considered in my medical decision making (see chart for details).  Clinical Course as of Nov 04 1635  Sun Nov 03, 2017  3350 27 year old female complaining of body aches nausea vomiting, cough and diarrhea.  She was initially tachycardic on arrival.  She is getting IV fluids and had some screening labs done.  Her chest x-ray is unremarkable.  Has received some medication for nausea and some steroids for her urticarial rash.  This is likely viral in origin and she will be discharged with symptomatic control.  She will need to follow-up with her primary care doctor return if any worsening symptoms.  [MB]    Clinical Course User Index [MB] Terrilee Files, MD   Regina Weaver is a 27 y.o. female who presents to ED for fever, cough, congestion, sore throat and body aches. Temp of 99.6. On exam, patient mildly tachycardic with tacky mucus membranes. Airway patent. OP with erythema, no  exudates or tonsillar hypertrophy. She does have urticarial rash to scalp. Hx of similar when she is sick or stressed. Non-surgical abdomen. Rapid strep negative CXR reviewed with no acute findings. Labs reviewed: CO2, H&H elevated at 15.8/46.6. IV hydration and nausea meds given. Benadryl / steroids given for rash. On re-evaluation, HR RRR. Patient feels improved. Tolerating PO. Discussed symptomatic care, increasing hydration. PCP follow up if symptoms persist. Return precautions discussed. Will give rx for zofran and steroid burst. All questions answered.   Final Clinical Impressions(s) / ED Diagnoses   Final diagnoses:  Cough with fever  Viral syndrome    ED Discharge Orders        Ordered    ondansetron (ZOFRAN ODT) 4 MG disintegrating tablet  Every 8 hours PRN     11/03/17 1555    predniSONE (DELTASONE) 20 MG tablet  Daily     11/03/17 1555       Javid Kemler, Chase Picket, PA-C 11/03/17 1607    Vedant Shehadeh, Chase Picket, PA-C 11/03/17 1637    Terrilee Files, MD 11/05/17 [phone]

## 2017-11-03 NOTE — ED Notes (Signed)
Have called pt x 2 with no answer

## 2017-11-05 LAB — CULTURE, GROUP A STREP (THRC)

## 2018-03-02 ENCOUNTER — Emergency Department (HOSPITAL_COMMUNITY)
Admission: EM | Admit: 2018-03-02 | Discharge: 2018-03-02 | Disposition: A | Discharge status: Home / Self Care | Payer: Self-pay | Attending: Physician Assistant | Admitting: Physician Assistant

## 2018-03-02 ENCOUNTER — Other Ambulatory Visit: Payer: Self-pay

## 2018-03-02 ENCOUNTER — Encounter (HOSPITAL_COMMUNITY): Payer: Self-pay

## 2018-03-02 DIAGNOSIS — Z87891 Personal history of nicotine dependence: Secondary | ICD-10-CM | POA: Insufficient documentation

## 2018-03-02 DIAGNOSIS — T7840XA Allergy, unspecified, initial encounter: Secondary | ICD-10-CM | POA: Insufficient documentation

## 2018-03-02 DIAGNOSIS — Z79899 Other long term (current) drug therapy: Secondary | ICD-10-CM | POA: Insufficient documentation

## 2018-03-02 LAB — I-STAT BETA HCG BLOOD, ED (MC, WL, AP ONLY): I-stat hCG, quantitative: <5 m[IU]/mL (ref <5)

## 2018-03-02 MED ORDER — METHYLPREDNISOLONE SODIUM SUCC 125 MG IJ SOLR
125.0000 mg | Freq: Once | INTRAMUSCULAR | Status: AC
  Administered 2018-03-02: 125 mg via INTRAVENOUS
  Filled 2018-03-02: qty 2

## 2018-03-02 MED ORDER — FAMOTIDINE IN NACL 20-0.9 MG/50ML-% IV SOLN
20.0000 mg | Freq: Once | INTRAVENOUS | Status: AC
  Administered 2018-03-02: 20 mg via INTRAVENOUS
  Filled 2018-03-02: qty 50

## 2018-03-02 MED ORDER — DIPHENHYDRAMINE HCL 50 MG/ML IJ SOLN
25.0000 mg | Freq: Once | INTRAMUSCULAR | Status: AC
  Administered 2018-03-02: 25 mg via INTRAVENOUS
  Filled 2018-03-02: qty 1

## 2018-03-02 MED ORDER — FAMOTIDINE 20 MG PO TABS: 20.0000 mg | ORAL_TABLET | Freq: Two times a day (BID) | ORAL | 0 refills | Status: DC

## 2018-03-02 MED ORDER — PREDNISONE 10 MG PO TABS: 40.0000 mg | ORAL_TABLET | Freq: Every day | ORAL | 0 refills | Status: AC

## 2018-03-02 MED ORDER — DIPHENHYDRAMINE HCL 25 MG PO TABS: 25.0000 mg | ORAL_TABLET | Freq: Four times a day (QID) | ORAL | 0 refills | Status: AC

## 2018-03-02 NOTE — ED Notes (Signed)
Pt feeling a little better.

## 2018-03-02 NOTE — ED Triage Notes (Signed)
Pt presents for evaluation of possible allergic reaction since Friday. No new exposures. Pt reports has taken benadryl since Friday with no improvement. Large hives to face, scalp, torso and extremities with mild itching. No benadryl today. Pt reports this morning her throat felt tighter.

## 2018-03-02 NOTE — ED Notes (Signed)
The pt reports that she is no better

## 2018-03-02 NOTE — ED Provider Notes (Signed)
[facility] EMERGENCY DEPARTMENT Provider Note   CSN: [id number] Arrival date & time: 03/02/18  1421     History   Chief Complaint Chief Complaint  Patient presents with  . Allergic Reaction    HPI Regina Weaver is a 27 y.o. female who presents to ED for evaluation urticaria for the past 2 days.  She states that she woke up 2 days ago with hives and right side of her upper lip mildly swollen.  She has been taking Benadryl with only mild improvement in her symptoms.  She reports history of similar symptoms in the past but has never been evaluated for formal allergy testing so she does not know what is causing the reaction.  She cannot recall any new soaps, lotions, detergents, medication or food ingestions that she may have had that triggered the symptoms.  Usually the symptoms improved with Benadryl.  She denies any anaphylaxis.  Denies any trouble breathing or trouble swallowing, insect bites.  HPI  Past Medical History:  Diagnosis Date  . SVD (spontaneous vaginal delivery)    x 3    Patient Active Problem List   Diagnosis Date Noted  . Labor and delivery, indication for care 11/03/2015  . Normal vaginal delivery 11/03/2015  . Postpartum care following vaginal delivery (1/26) 11/03/2015    Past Surgical History:  Procedure Laterality Date  . LAPAROSCOPIC TUBAL LIGATION Bilateral 12/23/2015   Procedure: LAPAROSCOPIC TUBAL LIGATION With Filshie Clips;  Surgeon: Genia Del, MD;  Location: [facility];  Service: Gynecology;  Laterality: Bilateral;  . WISDOM TOOTH EXTRACTION       OB History    Gravida  3   Para  3   Term  3   Preterm      AB      Living  1     SAB      TAB      Ectopic      Multiple  0   Live Births  1            Home Medications    Prior to Admission medications   Medication Sig Start Date End Date Taking? Authorizing Provider  diphenhydrAMINE (BENADRYL) 25 MG tablet Take 1 tablet (25 mg total) by mouth every  6 (six) hours. 03/02/18   Jasyah Theurer, PA-C  famotidine (PEPCID) 20 MG tablet Take 1 tablet (20 mg total) by mouth 2 (two) times daily. 03/02/18   Bascom Biel, PA-C  Guaifenesin 1200 MG TB12 Take 1 tablet (1,200 mg total) by mouth 2 (two) times daily. 12/25/15   Lawyer, Cristal Deer, PA-C  hydrocodone-ibuprofen (VICOPROFEN) 5-200 MG tablet Take 1 tablet by mouth every 8 (eight) hours as needed for pain. 12/23/15   Genia Del, MD  ibuprofen (ADVIL,MOTRIN) 800 MG tablet Take 1 tablet (800 mg total) every 8 (eight) hours as needed by mouth. 08/13/17   Long, Arlyss Repress, MD  ondansetron (ZOFRAN ODT) 4 MG disintegrating tablet Take 1 tablet (4 mg total) by mouth every 8 (eight) hours as needed for nausea or vomiting. 11/03/17   Ward, Chase Picket, PA-C  predniSONE (DELTASONE) 10 MG tablet Take 4 tablets (40 mg total) by mouth daily for 5 days. 03/02/18 03/07/18  Dietrich Pates, PA-C  Prenatal Vit-Fe Fumarate-FA (PRENATAL MULTIVITAMIN) TABS tablet Take 1 tablet by mouth daily at 12 noon.    [provider]  promethazine-dextromethorphan (PROMETHAZINE-DM) 6.25-15 MG/5ML syrup Take 5 mLs by mouth 4 (four) times daily as needed for cough. 12/25/15   Lawyer,  Cristal Deer, PA-C    Family History No family history on file.  Social History Social History   Tobacco Use  . Smoking status: Former Smoker    Packs/day: 0.25    Years: 5.00    Pack years: 1.25    Types: Cigarettes    Last attempt to quit: 10/08/2008    Years since quitting: 9.4  . Smokeless tobacco: Never Used  Substance Use Topics  . Alcohol use: Yes    Comment: occasional mixed drink  . Drug use: No     Allergies   Patient has no known allergies.   Review of Systems Review of Systems  Constitutional: Negative for appetite change, chills and fever.  HENT: Negative for ear pain, rhinorrhea, sneezing and sore throat.   Eyes: Negative for photophobia and visual disturbance.  Respiratory: Negative for cough, chest tightness,  shortness of breath and wheezing.   Cardiovascular: Negative for chest pain and palpitations.  Gastrointestinal: Negative for abdominal pain, blood in stool, constipation, diarrhea, nausea and vomiting.  Genitourinary: Negative for dysuria, hematuria and urgency.  Musculoskeletal: Negative for myalgias.  Skin: Positive for rash.  Neurological: Negative for dizziness, weakness and light-headedness.     Physical Exam Updated Vital Signs BP (!) 136/91 (BP Location: Right Arm)   Pulse 71   Temp 98.5 F (36.9 C) (Oral)   Resp 18   LMP 02/06/2018 (Approximate)   SpO2 99%   Physical Exam  Constitutional: She appears well-developed and well-nourished. No distress.  No lip swelling or signs of anaphylaxis noted.  Speaking in complete sentences without difficulty.  HENT:  Head: Normocephalic and atraumatic.  Nose: Nose normal.  Eyes: Conjunctivae and EOM are normal. Right eye exhibits no discharge. Left eye exhibits no discharge. No scleral icterus.  Neck: Normal range of motion. Neck supple.  Cardiovascular: Normal rate, regular rhythm, normal heart sounds and intact distal pulses. Exam reveals no gallop and no friction rub.  No murmur heard. Pulmonary/Chest: Effort normal and breath sounds normal. No respiratory distress.  Abdominal: Soft. Bowel sounds are normal. She exhibits no distension. There is no tenderness. There is no guarding.  Musculoskeletal: Normal range of motion. She exhibits no edema.  Neurological: She is alert. She exhibits normal muscle tone. Coordination normal.  Skin: Skin is warm and dry. Rash noted.  Scattered urticaria on bilateral upper extremities, torso, lower back; mildly on the face.  Psychiatric: She has a normal mood and affect.  Nursing note and vitals reviewed.    ED Treatments / Results  Labs (all labs ordered are listed, but only abnormal results are displayed) Labs Reviewed  I-STAT BETA HCG BLOOD, ED ([facility], [facility], [facility] ONLY)     EKG None  Radiology No results found.  Procedures Procedures (including critical care time)  Medications Ordered in ED Medications  methylPREDNISolone sodium succinate (SOLU-MEDROL) 125 mg/2 mL injection 125 mg (125 mg Intravenous Given 03/02/18 1502)  famotidine (PEPCID) IVPB 20 mg premix (0 mg Intravenous Stopped 03/02/18 1622)  diphenhydrAMINE (BENADRYL) injection 25 mg (25 mg Intravenous Given 03/02/18 1504)     Initial Impression / Assessment and Plan / ED Course  I have reviewed the triage vital signs and the nursing notes.  Pertinent labs & imaging results that were available during my care of the patient were reviewed by me and considered in my medical decision making (see chart for details).     Patient presents to ED for evaluation of urticaria for the past 2 days.  She woke up 2 days  with hives on her upper and lower extremities, torso.  She does report mild right upper lip swelling 2 days ago which has gradually improved with Benadryl.  She reports history of similar allergic type reaction in the past but does not know what has been causing it.  She denies any prior history of anaphylaxis.  Denies any new soaps, lotions, detergents, medication or food ingestions that may have triggered the symptoms.  On physical exam she is overall well-appearing.  There is no signs of angioedema or anaphylaxis noted.  There are diffuse urticaria with no overlying infectious changes noted in bilateral upper and lower extremities, torso.  She is tolerating secretions without difficulty.  Patient was given Solu-Medrol, Pepcid and Benadryl with improvement in her symptoms.  She continues to be in no acute distress with no signs of anaphylaxis noted.  I encouraged patient to keep a log of what she believes could possibly be causing her symptoms.  We will also refer her to allergy office for testing.  Will discharge home with remainder of prednisone course, antihistamines and advised her to return  for any severe worsening symptoms.  Portions of this note were generated with Scientist, clinical (histocompatibility and immunogenetics). Dictation errors may occur despite best attempts at proofreading.   Final Clinical Impressions(s) / ED Diagnoses   Final diagnoses:  Allergic reaction, initial encounter    ED Discharge Orders        Ordered    predniSONE (DELTASONE) 10 MG tablet  Daily     03/02/18 1658    famotidine (PEPCID) 20 MG tablet  2 times daily     03/02/18 1658    diphenhydrAMINE (BENADRYL) 25 MG tablet  Every 6 hours     03/02/18 1658       Dietrich Pates, PA-C 03/02/18 1706    Mackuen, Cindee Salt, MD 03/02/18 2206

## 2018-03-05 ENCOUNTER — Emergency Department (HOSPITAL_BASED_OUTPATIENT_CLINIC_OR_DEPARTMENT_OTHER)
Admission: EM | Admit: 2018-03-05 | Discharge: 2018-03-05 | Disposition: A | Discharge status: Home / Self Care | Payer: Self-pay | Attending: Emergency Medicine | Admitting: Emergency Medicine

## 2018-03-05 ENCOUNTER — Other Ambulatory Visit: Payer: Self-pay

## 2018-03-05 ENCOUNTER — Emergency Department (HOSPITAL_COMMUNITY)
Admission: EM | Admit: 2018-03-05 | Discharge: 2018-03-05 | Disposition: A | Discharge status: Home / Self Care | Payer: Self-pay | Attending: Emergency Medicine | Admitting: Emergency Medicine

## 2018-03-05 ENCOUNTER — Encounter (HOSPITAL_BASED_OUTPATIENT_CLINIC_OR_DEPARTMENT_OTHER): Payer: Self-pay | Admitting: Emergency Medicine

## 2018-03-05 ENCOUNTER — Encounter (HOSPITAL_COMMUNITY): Payer: Self-pay | Admitting: Emergency Medicine

## 2018-03-05 DIAGNOSIS — Z87891 Personal history of nicotine dependence: Secondary | ICD-10-CM | POA: Insufficient documentation

## 2018-03-05 DIAGNOSIS — Z79899 Other long term (current) drug therapy: Secondary | ICD-10-CM | POA: Insufficient documentation

## 2018-03-05 DIAGNOSIS — G43001 Migraine without aura, not intractable, with status migrainosus: Secondary | ICD-10-CM | POA: Insufficient documentation

## 2018-03-05 DIAGNOSIS — R112 Nausea with vomiting, unspecified: Secondary | ICD-10-CM

## 2018-03-05 DIAGNOSIS — R51 Headache: Secondary | ICD-10-CM | POA: Insufficient documentation

## 2018-03-05 DIAGNOSIS — R197 Diarrhea, unspecified: Secondary | ICD-10-CM

## 2018-03-05 LAB — CBC
HEMATOCRIT: 42.2 % (ref 36.0–46.0)
HEMOGLOBIN: 14 g/dL (ref 12.0–15.0)
MCH: 29.4 pg (ref 26.0–34.0)
MCHC: 33.2 g/dL (ref 30.0–36.0)
MCV: 88.5 fL (ref 78.0–100.0)
Platelets: 229 10*3/uL (ref 150–400)
RBC: 4.77 MIL/uL (ref 3.87–5.11)
RDW: 13.2 % (ref 11.5–15.5)
WBC: 8.7 10*3/uL (ref 4.0–10.5)

## 2018-03-05 LAB — BASIC METABOLIC PANEL
ANION GAP: 12 (ref 5–15)
BUN: 21 mg/dL — ABNORMAL HIGH (ref 6–20)
CHLORIDE: 107 mmol/L (ref 101–111)
CO2: 20 mmol/L — AB (ref 22–32)
Calcium: 9.2 mg/dL (ref 8.9–10.3)
Creatinine, Ser: 0.82 mg/dL (ref 0.44–1.00)
GFR calc Af Amer: >60 mL/min (ref >60)
GLUCOSE: 90 mg/dL (ref 65–99)
POTASSIUM: 3.4 mmol/L — AB (ref 3.5–5.1)
Sodium: 139 mmol/L (ref 135–145)

## 2018-03-05 LAB — I-STAT BETA HCG BLOOD, ED (MC, WL, AP ONLY): I-stat hCG, quantitative: <5 m[IU]/mL (ref <5)

## 2018-03-05 MED ORDER — ONDANSETRON 4 MG PO TBDP
4.0000 mg | ORAL_TABLET | Freq: Once | ORAL | Status: AC | PRN
  Administered 2018-03-05: 4 mg via ORAL
  Filled 2018-03-05: qty 1

## 2018-03-05 MED ORDER — MAGNESIUM SULFATE 2 GM/50ML IV SOLN
2.0000 g | Freq: Once | INTRAVENOUS | Status: AC
  Administered 2018-03-05: 2 g via INTRAVENOUS
  Filled 2018-03-05: qty 50

## 2018-03-05 MED ORDER — DIPHENHYDRAMINE HCL 50 MG/ML IJ SOLN
25.0000 mg | Freq: Once | INTRAMUSCULAR | Status: AC
  Administered 2018-03-05: 25 mg via INTRAVENOUS
  Filled 2018-03-05: qty 1

## 2018-03-05 MED ORDER — KETOROLAC TROMETHAMINE 30 MG/ML IJ SOLN
30.0000 mg | Freq: Once | INTRAMUSCULAR | Status: AC
  Administered 2018-03-05: 30 mg via INTRAVENOUS
  Filled 2018-03-05: qty 1

## 2018-03-05 MED ORDER — PROCHLORPERAZINE EDISYLATE 10 MG/2ML IJ SOLN
10.0000 mg | Freq: Once | INTRAMUSCULAR | Status: AC
  Administered 2018-03-05: 10 mg via INTRAVENOUS
  Filled 2018-03-05: qty 2

## 2018-03-05 MED ORDER — SODIUM CHLORIDE 0.9 % IV BOLUS
1000.0000 mL | Freq: Once | INTRAVENOUS | Status: AC
  Administered 2018-03-05: 1000 mL via INTRAVENOUS

## 2018-03-05 NOTE — ED Triage Notes (Signed)
Headache x 3 days with associated nausea and dizziness. [facility]en [facility] and left due to wait time.

## 2018-03-05 NOTE — ED Triage Notes (Signed)
Patient presents today with complaints of 3 day  history of headache. Patient reports hx of migrains states has light sensitivity  and nausea.  Dizziness started about 2000. Patient reports bilateral leg weakness. Patient reports has taken OTC medication without any relief.

## 2018-03-05 NOTE — ED Notes (Signed)
Pt verbalizes understanding of d/c instructions and denies any further needs at this time. 

## 2018-03-05 NOTE — ED Provider Notes (Signed)
[facility] EMERGENCY DEPARTMENT Provider Note   CSN: [id number] Arrival date & time: 03/05/18  1002     History   Chief Complaint Chief Complaint  Patient presents with  . Headache    HPI Regina Weaver is a 27 y.o. female.  27 year old female who presents with headache.  Patient states that 3 days ago she had a gradual onset of headache that eventually became severe.  Last night, she began having vomiting and diarrhea which have made the headache worse.  She has tried ibuprofen and Tylenol with no relief, last dose was yesterday evening.  She denies any associated fever, sore throat, cough/cold symptoms, sick contacts, or recent travel.  No vision changes, extremity weakness, or numbness.  She does endorse a dizziness and photophobia/phonophobia.  He presented to [facility], ER earlier this morning and left prior to evaluation due to wait time. She reports h/o headaches but states she has never had vomiting/diarrhea with them before.  The history is provided by the patient.  Headache      Past Medical History:  Diagnosis Date  . SVD (spontaneous vaginal delivery)    x 3    Patient Active Problem List   Diagnosis Date Noted  . Labor and delivery, indication for care 11/03/2015  . Normal vaginal delivery 11/03/2015  . Postpartum care following vaginal delivery (1/26) 11/03/2015    Past Surgical History:  Procedure Laterality Date  . LAPAROSCOPIC TUBAL LIGATION Bilateral 12/23/2015   Procedure: LAPAROSCOPIC TUBAL LIGATION With Filshie Clips;  Surgeon: Genia Del, MD;  Location: [facility];  Service: Gynecology;  Laterality: Bilateral;  . WISDOM TOOTH EXTRACTION       OB History    Gravida  3   Para  3   Term  3   Preterm      AB      Living  1     SAB      TAB      Ectopic      Multiple  0   Live Births  1            Home Medications    Prior to Admission medications   Medication Sig Start Date End Date Taking? Authorizing  Provider  diphenhydrAMINE (BENADRYL) 25 MG tablet Take 1 tablet (25 mg total) by mouth every 6 (six) hours. 03/02/18   Khatri, Hina, PA-C  famotidine (PEPCID) 20 MG tablet Take 1 tablet (20 mg total) by mouth 2 (two) times daily. 03/02/18   Khatri, Hina, PA-C  Guaifenesin 1200 MG TB12 Take 1 tablet (1,200 mg total) by mouth 2 (two) times daily. 12/25/15   Lawyer, Cristal Deer, PA-C  hydrocodone-ibuprofen (VICOPROFEN) 5-200 MG tablet Take 1 tablet by mouth every 8 (eight) hours as needed for pain. 12/23/15   Genia Del, MD  ibuprofen (ADVIL,MOTRIN) 800 MG tablet Take 1 tablet (800 mg total) every 8 (eight) hours as needed by mouth. 08/13/17   Long, Arlyss Repress, MD  ondansetron (ZOFRAN ODT) 4 MG disintegrating tablet Take 1 tablet (4 mg total) by mouth every 8 (eight) hours as needed for nausea or vomiting. 11/03/17   Ward, Chase Picket, PA-C  predniSONE (DELTASONE) 10 MG tablet Take 4 tablets (40 mg total) by mouth daily for 5 days. 03/02/18 03/07/18  Dietrich Pates, PA-C  Prenatal Vit-Fe Fumarate-FA (PRENATAL MULTIVITAMIN) TABS tablet Take 1 tablet by mouth daily at 12 noon.    [provider]  promethazine-dextromethorphan (PROMETHAZINE-DM) 6.25-15 MG/5ML syrup Take 5 mLs by mouth 4 (four)  times daily as needed for cough. 12/25/15   Lawyer, Cristal Deer, PA-C    Family History No family history on file.  Social History Social History   Tobacco Use  . Smoking status: Former Smoker    Packs/day: 0.25    Years: 5.00    Pack years: 1.25    Types: Cigarettes    Last attempt to quit: 10/08/2008    Years since quitting: 9.4  . Smokeless tobacco: Never Used  Substance Use Topics  . Alcohol use: Yes    Comment: occasional mixed drink  . Drug use: No     Allergies   Patient has no known allergies.   Review of Systems Review of Systems  Neurological: Positive for headaches.   All other systems reviewed and are negative except that which was mentioned in HPI   Physical Exam Updated  Vital Signs BP 112/74 (BP Location: Right Arm)   Pulse (!) 54   Temp 97.9 F (36.6 C) (Oral)   Resp 18   Ht  (1.575 m)   Wt 72.6 kg (160 lb)   LMP 02/06/2018 (Approximate)   SpO2 100%   BMI 29.26 kg/m   Physical Exam  Constitutional: She is oriented to person, place, and time. She appears well-developed and well-nourished. No distress.  Awake, alert uncomfortable  HENT:  Head: Normocephalic and atraumatic.  Eyes: Pupils are equal, round, and reactive to light. Conjunctivae and EOM are normal.  Neck: Neck supple.  Cardiovascular: Normal rate, regular rhythm and normal heart sounds.  No murmur heard. Pulmonary/Chest: Effort normal and breath sounds normal. No respiratory distress.  Abdominal: Soft. Bowel sounds are normal. She exhibits no distension. There is no tenderness.  Musculoskeletal: She exhibits no edema.  Neurological: She is alert and oriented to person, place, and time. She has normal reflexes. No cranial nerve deficit. She exhibits normal muscle tone.  Fluent speech, normal finger-to-nose testing, negative pronator drift, no clonus 5/5 strength and normal sensation x all 4 extremities  Skin: Skin is warm and dry.  Psychiatric: She has a normal mood and affect. Judgment and thought content normal.  Nursing note and vitals reviewed.    ED Treatments / Results  Labs (all labs ordered are listed, but only abnormal results are displayed) Labs Reviewed - No data to display  EKG None  Radiology No results found.  Procedures Procedures (including critical care time)  Medications Ordered in ED Medications  sodium chloride 0.9 % bolus 1,000 mL (0 mLs Intravenous Stopped 03/05/18 1416)  diphenhydrAMINE (BENADRYL) injection 25 mg (25 mg Intravenous Given 03/05/18 1211)  prochlorperazine (COMPAZINE) injection 10 mg (10 mg Intravenous Given 03/05/18 1214)  ketorolac (TORADOL) 30 MG/ML injection 30 mg (30 mg Intravenous Given 03/05/18 1214)  magnesium sulfate IVPB  2 g 50 mL (0 g Intravenous Stopped 03/05/18 1437)     Initial Impression / Assessment and Plan / ED Course  I have reviewed the triage vital signs and the nursing notes.  Pertinent labs & imaging results that were available during my care of the patient were reviewed by me and considered in my medical decision making (see chart for details).    Pt non-toxic on exam w/ normal VS. Normal neuro exam.  Headache is suggestive of migraine, her report of vomiting and diarrhea does suggest possible concurrent viral illness.  Given her normal neuro exam and no red flag symptoms, I do not feel she needs any head imaging.  Pregnancy test from earlier at [facility] was negative.  Gave Benadryl, Compazine, Toradol, and IV fluid bolus, and later Mg.   Patient was resting comfortably on reassessment and stated that her headache improved from 10/10 to 2/10.  She felt comfortable going home.  I discussed supportive measures for her symptoms.  Extensively reviewed return precautions with the patient and her significant other.  They voiced understanding.  Final Clinical Impressions(s) / ED Diagnoses   Final diagnoses:  Migraine without aura and with status migrainosus, not intractable  Nausea vomiting and diarrhea    ED Discharge Orders    None       Michaell Grider, Ambrose Finland, MD 03/05/18 1521

## 2019-01-27 ENCOUNTER — Other Ambulatory Visit: Payer: Self-pay

## 2019-01-27 ENCOUNTER — Encounter (HOSPITAL_COMMUNITY): Payer: Self-pay | Admitting: Emergency Medicine

## 2019-01-27 ENCOUNTER — Emergency Department (HOSPITAL_COMMUNITY)
Admission: EM | Admit: 2019-01-27 | Discharge: 2019-01-27 | Disposition: A | Discharge status: Home / Self Care | Payer: Self-pay | Attending: Emergency Medicine | Admitting: Emergency Medicine

## 2019-01-27 DIAGNOSIS — T161XXA Foreign body in right ear, initial encounter: Secondary | ICD-10-CM | POA: Insufficient documentation

## 2019-01-27 DIAGNOSIS — Z87891 Personal history of nicotine dependence: Secondary | ICD-10-CM | POA: Insufficient documentation

## 2019-01-27 DIAGNOSIS — Y999 Unspecified external cause status: Secondary | ICD-10-CM | POA: Insufficient documentation

## 2019-01-27 DIAGNOSIS — Y92032 Bedroom in apartment as the place of occurrence of the external cause: Secondary | ICD-10-CM | POA: Insufficient documentation

## 2019-01-27 DIAGNOSIS — Y9384 Activity, sleeping: Secondary | ICD-10-CM | POA: Insufficient documentation

## 2019-01-27 DIAGNOSIS — X58XXXA Exposure to other specified factors, initial encounter: Secondary | ICD-10-CM | POA: Insufficient documentation

## 2019-01-27 MED ORDER — LIDOCAINE HCL (PF) 1 % IJ SOLN
5.0000 mL | Freq: Once | INTRAMUSCULAR | Status: AC
  Administered 2019-01-27: 04:00:00 5 mL
  Filled 2019-01-27: qty 5

## 2019-01-27 NOTE — ED Provider Notes (Signed)
[facility] EMERGENCY DEPARTMENT Provider Note  CSN: [id number] Arrival date & time: 01/27/19 7829  Chief Complaint(s) Foreign Body in Ear  HPI Regina Weaver is a 28 y.o. female   The history is provided by the patient.  Foreign Body in Ear  This is a new problem. The current episode started 1 to 2 hours ago. The problem occurs constantly. The problem has not changed since onset.Pertinent negatives include no chest pain, no abdominal pain, no headaches and no shortness of breath. Nothing aggravates the symptoms. Nothing relieves the symptoms. Treatments tried: flushing. The treatment provided no relief.    Past Medical History Past Medical History:  Diagnosis Date   SVD (spontaneous vaginal delivery)    x 3   Patient Active Problem List   Diagnosis Date Noted   Labor and delivery, indication for care 11/03/2015   Normal vaginal delivery 11/03/2015   Postpartum care following vaginal delivery (1/26) 11/03/2015   Home Medication(s) Prior to Admission medications   Medication Sig Start Date End Date Taking? Authorizing Provider  diphenhydrAMINE (BENADRYL) 25 MG tablet Take 1 tablet (25 mg total) by mouth every 6 (six) hours. 03/02/18   Khatri, Hina, PA-C  famotidine (PEPCID) 20 MG tablet Take 1 tablet (20 mg total) by mouth 2 (two) times daily. 03/02/18   Khatri, Hina, PA-C  Guaifenesin 1200 MG TB12 Take 1 tablet (1,200 mg total) by mouth 2 (two) times daily. 12/25/15   Lawyer, Cristal Deer, PA-C  hydrocodone-ibuprofen (VICOPROFEN) 5-200 MG tablet Take 1 tablet by mouth every 8 (eight) hours as needed for pain. 12/23/15   Genia Del, MD  ibuprofen (ADVIL,MOTRIN) 800 MG tablet Take 1 tablet (800 mg total) every 8 (eight) hours as needed by mouth. 08/13/17   Long, Arlyss Repress, MD  ondansetron (ZOFRAN ODT) 4 MG disintegrating tablet Take 1 tablet (4 mg total) by mouth every 8 (eight) hours as needed for nausea or vomiting. 11/03/17   Ward, Chase Picket, PA-C    Prenatal Vit-Fe Fumarate-FA (PRENATAL MULTIVITAMIN) TABS tablet Take 1 tablet by mouth daily at 12 noon.    [provider]  promethazine-dextromethorphan (PROMETHAZINE-DM) 6.25-15 MG/5ML syrup Take 5 mLs by mouth 4 (four) times daily as needed for cough. 12/25/15   Charlestine Night, PA-C                                                                                                                                    Past Surgical History Past Surgical History:  Procedure Laterality Date   LAPAROSCOPIC TUBAL LIGATION Bilateral 12/23/2015   Procedure: LAPAROSCOPIC TUBAL LIGATION With Filshie Clips;  Surgeon: Genia Del, MD;  Location: [facility];  Service: Gynecology;  Laterality: Bilateral;   WISDOM TOOTH EXTRACTION     Family History History reviewed. No pertinent family history.  Social History Social History   Tobacco Use   Smoking status: Former Smoker    Packs/day: 0.25    Years:  5.00    Pack years: 1.25    Types: Cigarettes    Last attempt to quit: 10/08/2008    Years since quitting: 10.3   Smokeless tobacco: Never Used  Substance Use Topics   Alcohol use: Yes    Comment: occasional mixed drink   Drug use: No   Allergies Patient has no known allergies.  Review of Systems Review of Systems  Respiratory: Negative for shortness of breath.   Cardiovascular: Negative for chest pain.  Gastrointestinal: Negative for abdominal pain.  Neurological: Negative for headaches.   As noted in HPI Physical Exam Vital Signs  I have reviewed the triage vital signs BP 116/67 (BP Location: Right Arm)    Pulse 96    Temp 97.9 F (36.6 C) (Oral)    Resp 16    Ht 5\' 2"  (1.575 m)    Wt 72.6 kg    SpO2 99%    BMI 29.26 kg/m   Physical Exam Vitals signs reviewed.  Constitutional:      General: She is not in acute distress.    Appearance: She is well-developed. She is not diaphoretic.  HENT:     Head: Normocephalic and atraumatic.     Right Ear: Tympanic membrane  normal. A foreign body (intact insect) is present.     Left Ear: External ear normal.     Nose: Nose normal.  Eyes:     General: No scleral icterus.    Conjunctiva/sclera: Conjunctivae normal.  Neck:     Musculoskeletal: Normal range of motion.     Trachea: Phonation normal.  Cardiovascular:     Rate and Rhythm: Normal rate and regular rhythm.  Pulmonary:     Effort: Pulmonary effort is normal. No respiratory distress.     Breath sounds: No stridor.  Abdominal:     General: There is no distension.  Musculoskeletal: Normal range of motion.  Neurological:     Mental Status: She is alert and oriented to person, place, and time.  Psychiatric:        Behavior: Behavior normal.     ED Results and Treatments Labs (all labs ordered are listed, but only abnormal results are displayed) Labs Reviewed - No data to display                                                                                                                       EKG  EKG Interpretation  Date/Time:    Ventricular Rate:    PR Interval:    QRS Duration:   QT Interval:    QTC Calculation:   R Axis:     Text Interpretation:        Radiology No results found. Pertinent labs & imaging results that were available during my care of the patient were reviewed by me and considered in my medical decision making (see chart for details).  Medications Ordered in ED Medications  lidocaine (PF) (XYLOCAINE) 1 % injection 5 mL (5 mLs Other Given 01/27/19 0348)  Procedures .Foreign Body Removal Date/Time: 01/27/2019 4:46 AM Performed by: Nira Connardama, Sulay Brymer Eduardo, MD Authorized by: Nira Connardama, Shalayah Beagley Eduardo, MD  Consent: Verbal consent obtained. Risks and benefits: risks, benefits and alternatives were discussed Consent given by: patient Patient understanding: patient states understanding of the  procedure being performed Patient identity confirmed: verbally with patient Body area: ear Location details: right ear  Anesthesia: Local Anesthetic: lidocaine 1% without epinephrine  Sedation: Patient sedated: no  Patient cooperative: yes Localization method: visualized Removal mechanism: alligator forceps Complexity: simple 1 objects recovered. Objects recovered: insect Post-procedure assessment: foreign body removed Patient tolerance: Patient tolerated the procedure well with no immediate complications    (including critical care time)  Medical Decision Making / ED Course I have reviewed the nursing notes for this encounter and the patient's prior records (if available in EHR or on provided paperwork).    Insect removed. No damage to the canal or TM noted post removal.  The patient appears reasonably screened and/or stabilized for discharge and I doubt any other medical condition or other [facility] requiring further screening, evaluation, or treatment in the ED at this time prior to discharge.  The patient is safe for discharge with strict return precautions.   Final Clinical Impression(s) / ED Diagnoses Final diagnoses:  Foreign body of right ear, initial encounter   Disposition: Discharge  Condition: Good  I have discussed the results, Dx and Tx plan with the patient who expressed understanding and agree(s) with the plan. Discharge instructions discussed at great length. The patient was given strict return precautions who verbalized understanding of the instructions. No further questions at time of discharge.    ED Discharge Orders    None       Follow Up: Primary care provider  Schedule an appointment as soon as possible for a visit        This chart was dictated using voice recognition software.  Despite best efforts to proofread,  errors can occur which can change the documentation meaning.   Nira Connardama, Media Pizzini Eduardo, MD 01/27/19 [phone]

## 2019-01-27 NOTE — ED Triage Notes (Signed)
C/O of bug in ear. Pt states she woke up to the bug entering her ear and could feel it moving around. Foreign body noted upon inspection.

## 2019-01-27 NOTE — ED Notes (Signed)
Patient verbalizes understanding of discharge instructions. Opportunity for questioning and answers were provided. Armband removed by staff, pt discharged from ED ambulatory.   

## 2019-03-12 IMAGING — DX DG CHEST 2V
2 series · 2 of 2 positions shown · non-contrast
Comparison: 08/13/2017

CLINICAL DATA: Cough and fever for 2 days

EXAM:
CHEST  2 VIEW

[chest pa]
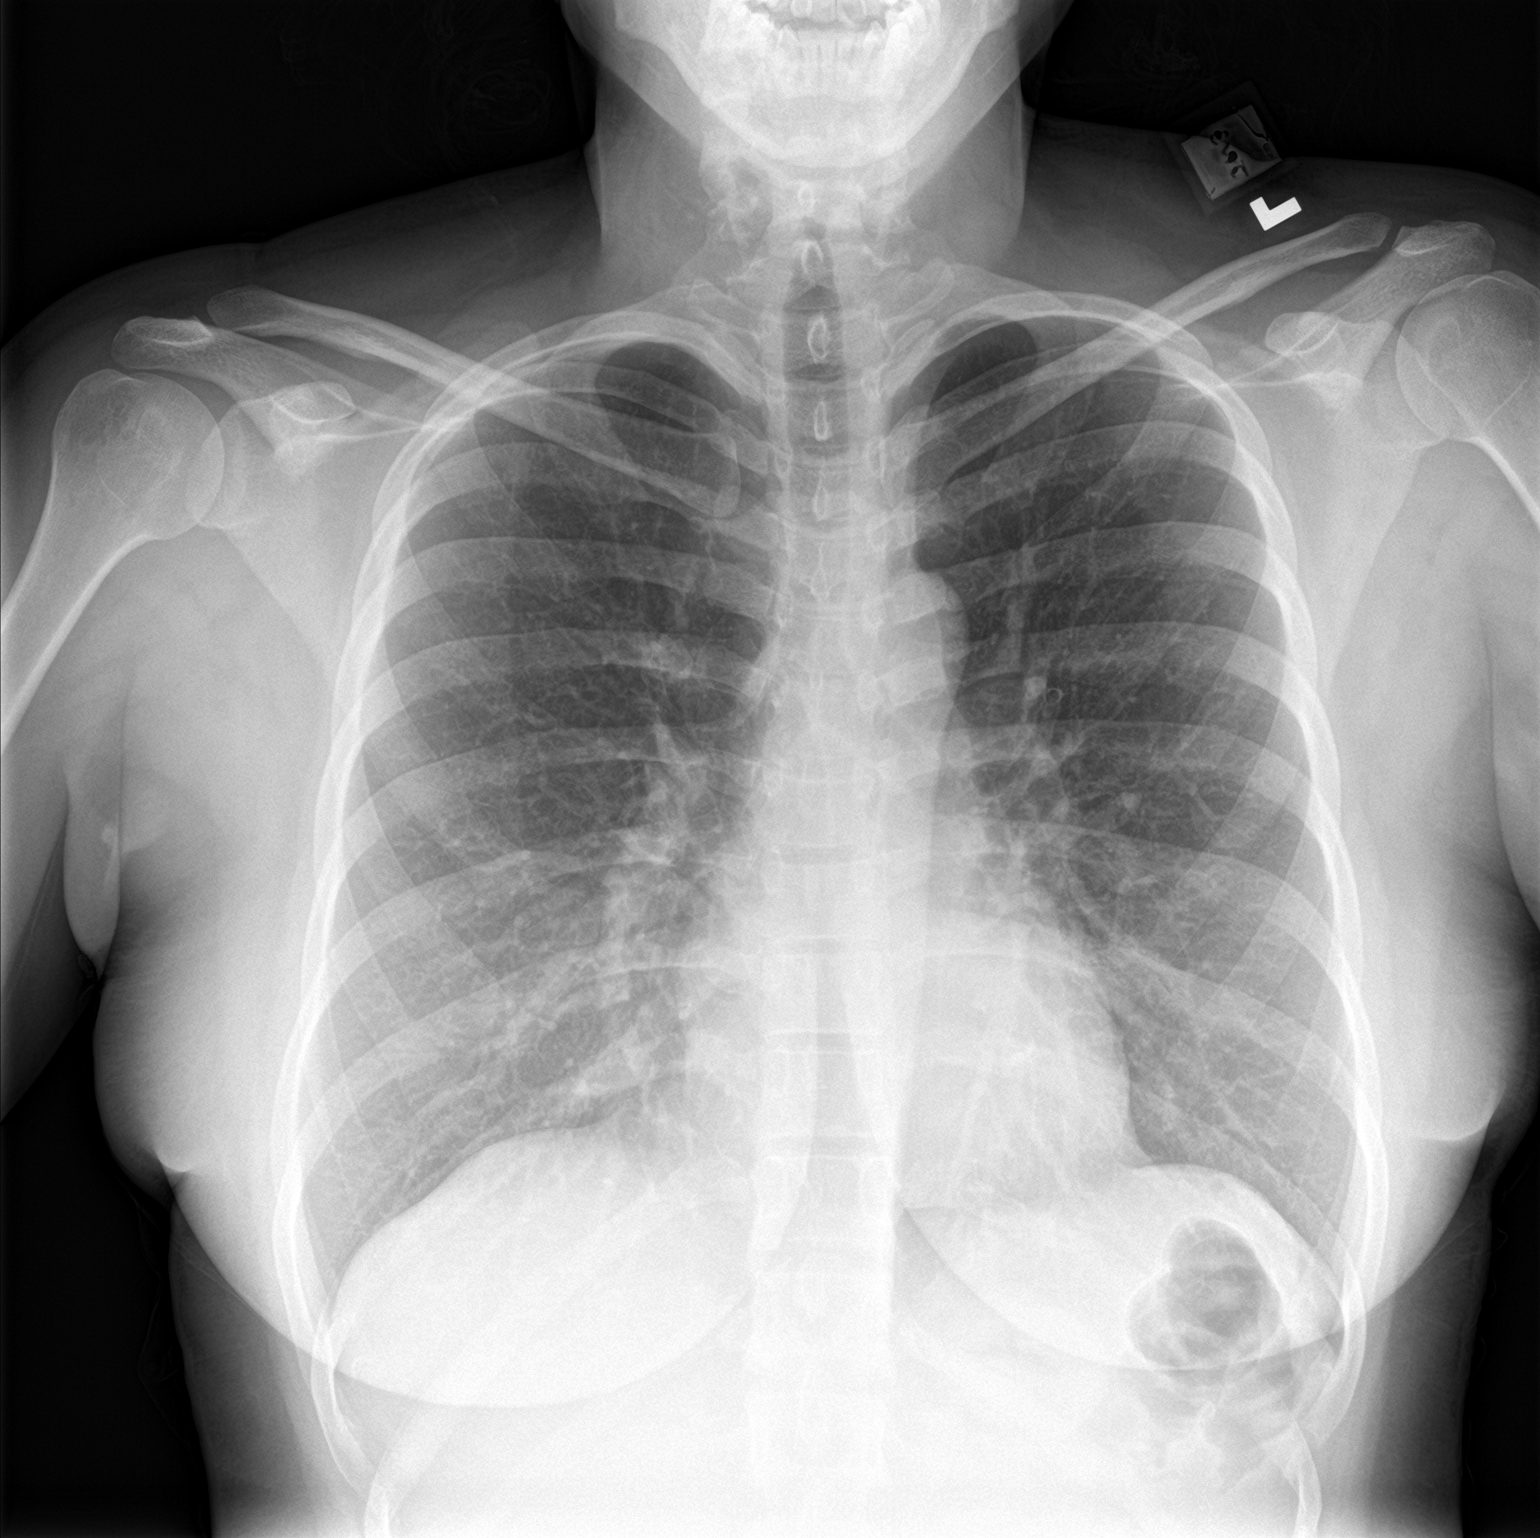

[chest lat]
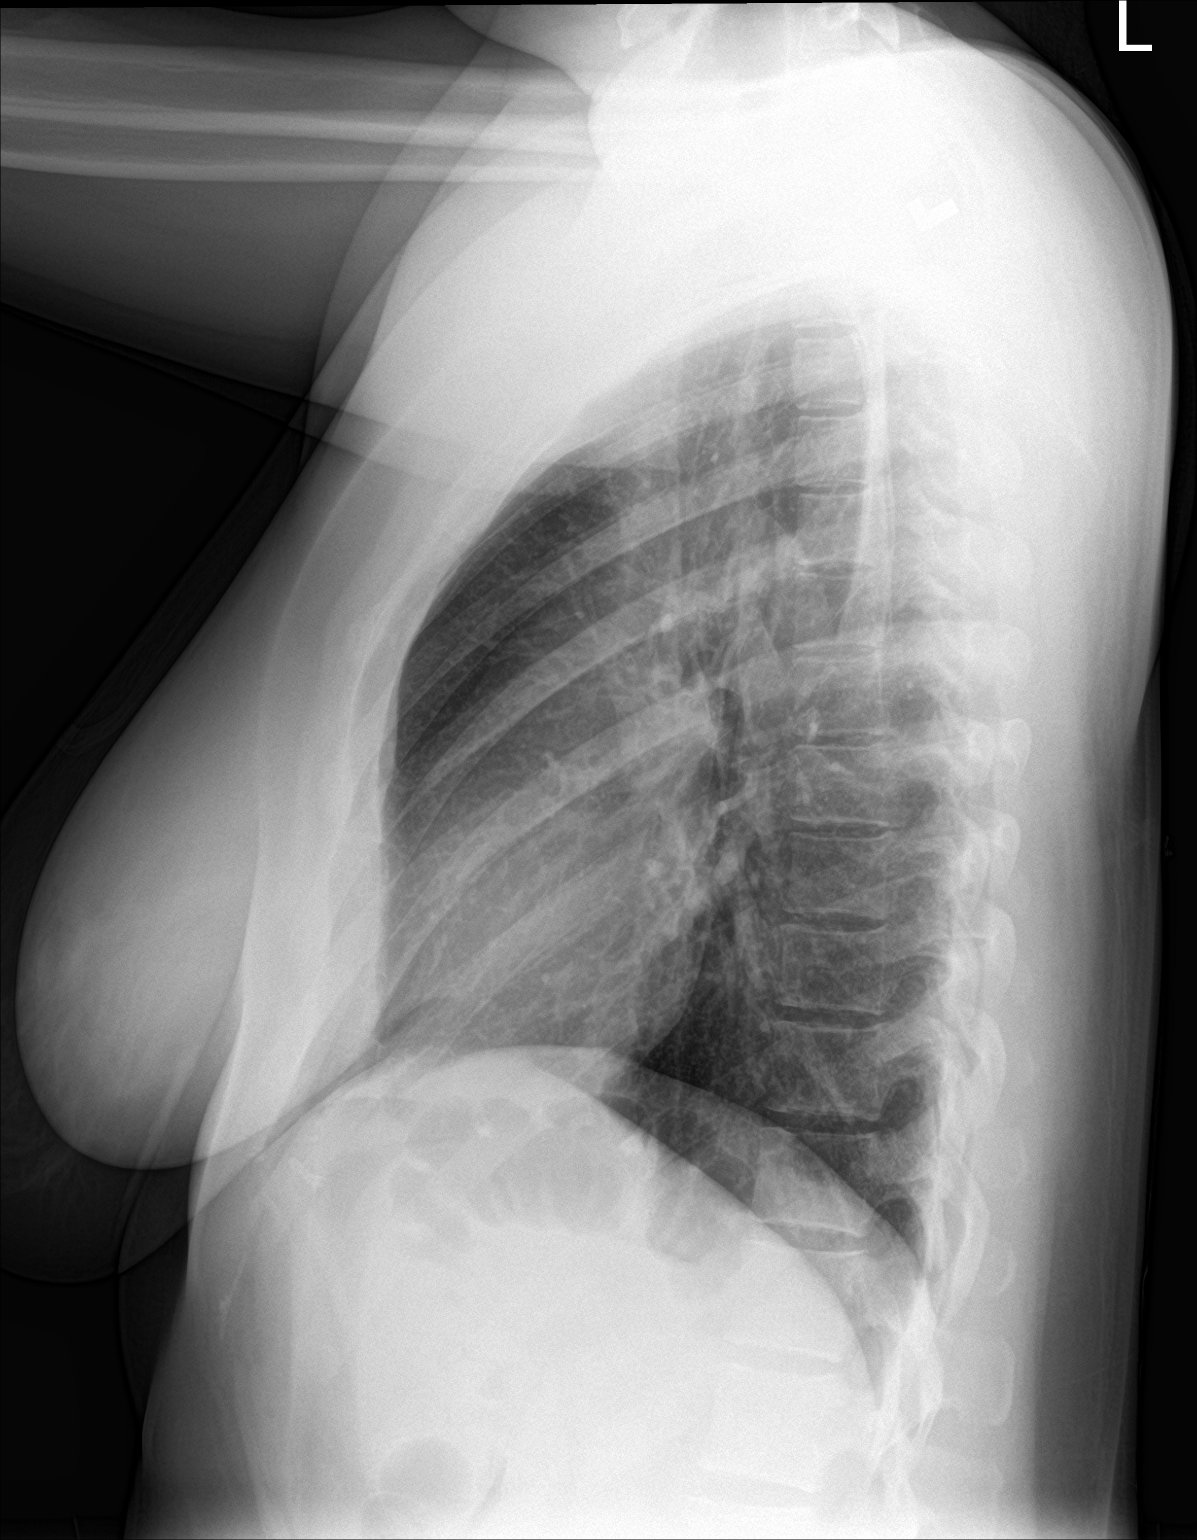

[2 of 2 positions shown; findings below may reference images not displayed]

FINDINGS: The heart size and mediastinal contours are within normal limits.
Both lungs are clear. The visualized skeletal structures are
unremarkable.
IMPRESSION: No active cardiopulmonary disease.

## 2019-04-28 ENCOUNTER — Emergency Department (HOSPITAL_COMMUNITY)
Admission: EM | Admit: 2019-04-28 | Discharge: 2019-04-28 | Disposition: A | Discharge status: Home / Self Care | Payer: Self-pay | Attending: Emergency Medicine | Admitting: Emergency Medicine

## 2019-04-28 ENCOUNTER — Other Ambulatory Visit: Payer: Self-pay

## 2019-04-28 ENCOUNTER — Encounter (HOSPITAL_COMMUNITY): Payer: Self-pay | Admitting: *Deleted

## 2019-04-28 DIAGNOSIS — Z87891 Personal history of nicotine dependence: Secondary | ICD-10-CM | POA: Insufficient documentation

## 2019-04-28 DIAGNOSIS — Z79899 Other long term (current) drug therapy: Secondary | ICD-10-CM | POA: Insufficient documentation

## 2019-04-28 DIAGNOSIS — L509 Urticaria, unspecified: Secondary | ICD-10-CM | POA: Insufficient documentation

## 2019-04-28 MED ORDER — PREDNISONE 20 MG PO TABS: 40.0000 mg | ORAL_TABLET | Freq: Every day | ORAL | 0 refills | Status: AC

## 2019-04-28 MED ORDER — PREDNISONE 20 MG PO TABS
40.0000 mg | ORAL_TABLET | Freq: Once | ORAL | Status: AC
  Administered 2019-04-28: 40 mg via ORAL
  Filled 2019-04-28: qty 2

## 2019-04-28 MED ORDER — LORATADINE 10 MG PO TABS: 10.0000 mg | ORAL_TABLET | Freq: Every day | ORAL | 0 refills | Status: AC

## 2019-04-28 MED ORDER — LORATADINE 10 MG PO TABS
10.0000 mg | ORAL_TABLET | Freq: Once | ORAL | Status: AC
  Administered 2019-04-28: 10 mg via ORAL
  Filled 2019-04-28: qty 1

## 2019-04-28 MED ORDER — FAMOTIDINE 20 MG PO TABS: 20.0000 mg | ORAL_TABLET | Freq: Two times a day (BID) | ORAL | 0 refills | Status: AC

## 2019-04-28 MED ORDER — FAMOTIDINE 20 MG PO TABS
20.0000 mg | ORAL_TABLET | Freq: Once | ORAL | Status: AC
  Administered 2019-04-28: 20 mg via ORAL
  Filled 2019-04-28: qty 1

## 2019-04-28 NOTE — Discharge Instructions (Signed)
Please read attached information. If you experience any new or worsening signs or symptoms please return to the emergency room for evaluation. Please follow-up with your primary care provider or specialist as discussed. Please use medication prescribed only as directed and discontinue taking if you have any concerning signs or symptoms.   °

## 2019-04-28 NOTE — ED Provider Notes (Signed)
[facility]GENC[facility] Provider Note   CSN: [id number] Arrival date & time: 04/28/19  1349    History   Chief Complaint Chief Complaint  Patient presents with  . Urticaria    HPI Regina Weaver is a 28 y.o. female.     HPI   28 year old female presents today with complaints of hives.  Patient notes that on Saturday she had swelling to her bilateral feet after work.  She notes the next day she woke up with hives throughout her body.  She denies any intraoral lesions.  She denies any fever insect bites recent infections or any abnormal exposure.  She notes she has had these previously from uncertain etiology.  She does note that wearing tight close causes her hives to come out.  She took Benadryl last night to help her sleep.  Previously she had taken steroids which improved her symptoms.  No trouble breathing shortness of breath or swelling of the mouth or face.  No known allergies.  She is not pregnant.   Past Medical History:  Diagnosis Date  . SVD (spontaneous vaginal delivery)    x 3    Patient Active Problem List   Diagnosis Date Noted  . Labor and delivery, indication for care 11/03/2015  . Normal vaginal delivery 11/03/2015  . Postpartum care following vaginal delivery (1/26) 11/03/2015    Past Surgical History:  Procedure Laterality Date  . LAPAROSCOPIC TUBAL LIGATION Bilateral 12/23/2015   Procedure: LAPAROSCOPIC TUBAL LIGATION With Filshie Clips;  Surgeon: Genia DelMarie-Lyne Lavoie, MD;  Location: [facility];  Service: Gynecology;  Laterality: Bilateral;  . WISDOM TOOTH EXTRACTION       OB History    Gravida  3   Para  3   Term  3   Preterm      AB      Living  1     SAB      TAB      Ectopic      Multiple  0   Live Births  1            Home Medications    Prior to Admission medications   Medication Sig Start Date End Date Taking? Authorizing Provider  diphenhydrAMINE (BENADRYL) 25 MG tablet Take 1 tablet (25 mg  total) by mouth every 6 (six) hours. 03/02/18   Khatri, Hina, PA-C  famotidine (PEPCID) 20 MG tablet Take 1 tablet (20 mg total) by mouth 2 (two) times daily. 04/28/19   Alice Burnside, Tinnie GensJeffrey, PA-C  Guaifenesin 1200 MG TB12 Take 1 tablet (1,200 mg total) by mouth 2 (two) times daily. 12/25/15   Lawyer, Cristal Deerhristopher, PA-C  hydrocodone-ibuprofen (VICOPROFEN) 5-200 MG tablet Take 1 tablet by mouth every 8 (eight) hours as needed for pain. 12/23/15   Genia DelLavoie, Marie-Lyne, MD  ibuprofen (ADVIL,MOTRIN) 800 MG tablet Take 1 tablet (800 mg total) every 8 (eight) hours as needed by mouth. 08/13/17   Long, Arlyss RepressJoshua G, MD  loratadine (CLARITIN) 10 MG tablet Take 1 tablet (10 mg total) by mouth daily. 04/28/19   Kelcey Wickstrom, Tinnie GensJeffrey, PA-C  ondansetron (ZOFRAN ODT) 4 MG disintegrating tablet Take 1 tablet (4 mg total) by mouth every 8 (eight) hours as needed for nausea or vomiting. 11/03/17   Ward, Chase PicketJaime Pilcher, PA-C  predniSONE (DELTASONE) 20 MG tablet Take 2 tablets (40 mg total) by mouth daily. 04/28/19   Belvin Gauss, Tinnie GensJeffrey, PA-C  Prenatal Vit-Fe Fumarate-FA (PRENATAL MULTIVITAMIN) TABS tablet Take 1 tablet by mouth daily at 12 noon.  [provider]  promethazine-dextromethorphan (PROMETHAZINE-DM) 6.25-15 MG/5ML syrup Take 5 mLs by mouth 4 (four) times daily as needed for cough. 12/25/15   Charlestine NightLawyer, Christopher, PA-C    Family History History reviewed. No pertinent family history.  Social History Social History   Tobacco Use  . Smoking status: Former Smoker    Packs/day: 0.25    Years: 5.00    Pack years: 1.25    Types: Cigarettes    Quit date: 10/08/2008    Years since quitting: 10.5  . Smokeless tobacco: Never Used  Substance Use Topics  . Alcohol use: Yes    Comment: occasional mixed drink  . Drug use: No     Allergies   Patient has no known allergies.   Review of Systems Review of Systems  All other systems reviewed and are negative.    Physical Exam Updated Vital Signs BP 110/82 (BP Location:  Right Arm)   Pulse 70   Temp 98.4 F (36.9 C) (Oral)   Resp 16   Ht 5' 2.5" (1.588 m)   Wt 72.6 kg   LMP 04/07/2019   SpO2 99%   BMI 28.80 kg/m   Physical Exam Vitals signs and nursing note reviewed.  Constitutional:      Appearance: She is well-developed.  HENT:     Head: Normocephalic and atraumatic.     Comments: No intraoral lesions-no swelling to the oropharynx, voice is normal Eyes:     General: No scleral icterus.       Right eye: No discharge.        Left eye: No discharge.     Conjunctiva/sclera: Conjunctivae normal.     Pupils: Pupils are equal, round, and reactive to light.  Neck:     Musculoskeletal: Normal range of motion.     Vascular: No JVD.     Trachea: No tracheal deviation.  Pulmonary:     Effort: Pulmonary effort is normal. No respiratory distress.     Breath sounds: Normal breath sounds. No stridor. No wheezing, rhonchi or rales.  Skin:    Comments: Hives noted to the upper and lower extremities and neck  Neurological:     Mental Status: She is alert and oriented to person, place, and time.     Coordination: Coordination normal.  Psychiatric:        Behavior: Behavior normal.        Thought Content: Thought content normal.        Judgment: Judgment normal.     ED Treatments / Results  Labs (all labs ordered are listed, but only abnormal results are displayed) Labs Reviewed - No data to display  EKG None  Radiology No results found.  Procedures Procedures (including critical care time)  Medications Ordered in ED Medications  loratadine (CLARITIN) tablet 10 mg (10 mg Oral Given 04/28/19 1522)  famotidine (PEPCID) tablet 20 mg (20 mg Oral Given 04/28/19 1522)  predniSONE (DELTASONE) tablet 40 mg (40 mg Oral Given 04/28/19 1522)     Initial Impression / Assessment and Plan / ED Course  I have reviewed the triage vital signs and the nursing notes.  Pertinent labs & imaging results that were available during my care of the patient were  reviewed by me and considered in my medical decision making (see chart for details).        28 year old female presents today with hives.  No signs of angioedema or significant reaction.  Patient did have benefit previously with steroids.  She will be  given antihistamines and steroids discharged with allergy testing and strict return precautions.  She verbalized understanding and agreement to today's plan had no further questions or concerns at time of discharge.  Final Clinical Impressions(s) / ED Diagnoses   Final diagnoses:  Urticaria    ED Discharge Orders         Ordered    famotidine (PEPCID) 20 MG tablet  2 times daily     04/28/19 1506    loratadine (CLARITIN) 10 MG tablet  Daily     04/28/19 1506    predniSONE (DELTASONE) 20 MG tablet  Daily     04/28/19 1506           Francee Gentile 04/30/19 1238    Julianne Rice, MD 05/01/19 2208

## 2019-04-28 NOTE — ED Triage Notes (Signed)
PT reports she has hives all over her body and swelling. On assessment Pt has Lg red raised hives to back,arm. Pt also reports itching. No known ALL known to PT.

## 2019-04-28 NOTE — ED Notes (Signed)
Pt verbalizes having the opportunity to have questions answered, verbalizes understanding of discharge instructions

## 2020-03-28 ENCOUNTER — Other Ambulatory Visit: Payer: Self-pay

## 2020-03-28 ENCOUNTER — Encounter (HOSPITAL_COMMUNITY): Payer: Self-pay | Admitting: Emergency Medicine

## 2020-03-28 ENCOUNTER — Emergency Department (HOSPITAL_COMMUNITY)
Admission: EM | Admit: 2020-03-28 | Discharge: 2020-03-28 | Disposition: A | Discharge status: Home / Self Care | Payer: Self-pay | Attending: Emergency Medicine | Admitting: Emergency Medicine

## 2020-03-28 DIAGNOSIS — Z5321 Procedure and treatment not carried out due to patient leaving prior to being seen by health care provider: Secondary | ICD-10-CM | POA: Insufficient documentation

## 2020-03-28 DIAGNOSIS — L509 Urticaria, unspecified: Secondary | ICD-10-CM | POA: Insufficient documentation

## 2020-03-28 NOTE — ED Triage Notes (Signed)
Pt. Stated, Im having hives all over since Friday. Ive taken Benadryl , but I worked all weekend.

## 2020-03-28 NOTE — ED Triage Notes (Signed)
Pt. Stated, My throat is tight and itchy.

## 2020-03-28 NOTE — ED Notes (Signed)
Pt called x 3  No answer. 

## 2023-03-15 ENCOUNTER — Ambulatory Visit: Payer: Self-pay

## 2024-05-10 ENCOUNTER — Ambulatory Visit: Payer: Self-pay

## 2024-08-22 ENCOUNTER — Encounter (HOSPITAL_COMMUNITY): Payer: Self-pay | Admitting: *Deleted

## 2024-08-22 ENCOUNTER — Emergency Department (HOSPITAL_COMMUNITY)
Admission: EM | Admit: 2024-08-22 | Discharge: 2024-08-23 | Payer: Self-pay | Attending: Emergency Medicine | Admitting: Emergency Medicine

## 2024-08-22 ENCOUNTER — Other Ambulatory Visit: Payer: Self-pay

## 2024-08-22 DIAGNOSIS — Z5321 Procedure and treatment not carried out due to patient leaving prior to being seen by health care provider: Secondary | ICD-10-CM | POA: Insufficient documentation

## 2024-08-22 DIAGNOSIS — R112 Nausea with vomiting, unspecified: Secondary | ICD-10-CM | POA: Insufficient documentation

## 2024-08-22 DIAGNOSIS — R519 Headache, unspecified: Secondary | ICD-10-CM | POA: Insufficient documentation
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
## 2024-08-22 NOTE — ED Triage Notes (Signed)
 Pt c/o nausea and a headache

## 2024-08-23 NOTE — ED Notes (Signed)
 Patient left.

## 2024-09-29 ENCOUNTER — Ambulatory Visit: Payer: Self-pay
